# Patient Record
Sex: Female | Born: 1990 | Hispanic: Yes | Marital: Single | Smoking: Never smoker
Health system: Southern US, Community
[De-identification: ages and names within clinical notes are randomized; demographics above are authoritative.]

## PROBLEM LIST (undated history)

## (undated) ENCOUNTER — Inpatient Hospital Stay (HOSPITAL_COMMUNITY): Payer: Self-pay

## (undated) DIAGNOSIS — O24419 Gestational diabetes mellitus in pregnancy, unspecified control: Secondary | ICD-10-CM

## (undated) DIAGNOSIS — B999 Unspecified infectious disease: Secondary | ICD-10-CM

## (undated) DIAGNOSIS — Z8619 Personal history of other infectious and parasitic diseases: Secondary | ICD-10-CM

## (undated) DIAGNOSIS — R51 Headache: Secondary | ICD-10-CM

## (undated) DIAGNOSIS — R519 Headache, unspecified: Secondary | ICD-10-CM

## (undated) HISTORY — PX: NO PAST SURGERIES: SHX2092

## (undated) HISTORY — DX: Headache: R51

## (undated) HISTORY — DX: Headache, unspecified: R51.9

## (undated) HISTORY — DX: Personal history of other infectious and parasitic diseases: Z86.19

## (undated) HISTORY — PX: WISDOM TOOTH EXTRACTION: SHX21

---
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
## 2008-06-25 ENCOUNTER — Emergency Department (HOSPITAL_COMMUNITY): Admission: EM | Admit: 2008-06-25 | Discharge: 2008-06-25 | Payer: Self-pay | Admitting: Emergency Medicine

## 2009-07-24 ENCOUNTER — Emergency Department (HOSPITAL_COMMUNITY): Admission: EM | Admit: 2009-07-24 | Discharge: 2009-07-24 | Payer: Self-pay | Admitting: Emergency Medicine

## 2009-08-17 ENCOUNTER — Emergency Department (HOSPITAL_COMMUNITY): Admission: EM | Admit: 2009-08-17 | Discharge: 2009-08-17 | Payer: Self-pay | Admitting: Emergency Medicine

## 2009-09-30 ENCOUNTER — Emergency Department (HOSPITAL_COMMUNITY): Admission: EM | Admit: 2009-09-30 | Discharge: 2009-09-30 | Payer: Self-pay | Admitting: Emergency Medicine

## 2009-11-04 ENCOUNTER — Emergency Department (HOSPITAL_COMMUNITY): Admission: EM | Admit: 2009-11-04 | Discharge: 2009-11-04 | Payer: Self-pay | Admitting: Emergency Medicine

## 2010-07-05 LAB — URINALYSIS, ROUTINE W REFLEX MICROSCOPIC
Bilirubin Urine: NEGATIVE
Glucose, UA: NEGATIVE mg/dL
Hgb urine dipstick: NEGATIVE
Ketones, ur: NEGATIVE mg/dL
Urobilinogen, UA: 1 mg/dL (ref 0.0–1.0)
pH: 6 (ref 5.0–8.0)

## 2010-07-05 LAB — DIFFERENTIAL
Basophils Absolute: 0 10*3/uL (ref 0.0–0.1)
Eosinophils Absolute: 0.1 10*3/uL (ref 0.0–0.7)
Lymphs Abs: 2 10*3/uL (ref 0.7–4.0)
Monocytes Absolute: 0.6 10*3/uL (ref 0.1–1.0)
Neutro Abs: 3.8 10*3/uL (ref 1.7–7.7)
Neutrophils Relative %: 58 % (ref 43–77)

## 2010-07-05 LAB — URINE MICROSCOPIC-ADD ON

## 2010-07-05 LAB — URINE CULTURE: Culture: NO GROWTH

## 2010-07-05 LAB — CBC
Hemoglobin: 14 g/dL (ref 12.0–15.0)
MCHC: 33.7 g/dL (ref 30.0–36.0)
WBC: 6.5 10*3/uL (ref 4.0–10.5)

## 2010-07-05 LAB — LIPASE, BLOOD: Lipase: 36 U/L (ref 11–59)

## 2010-07-05 LAB — COMPREHENSIVE METABOLIC PANEL
Calcium: 9.2 mg/dL (ref 8.4–10.5)
Potassium: 3.6 mEq/L (ref 3.5–5.1)
Total Bilirubin: 0.5 mg/dL (ref 0.3–1.2)
Total Protein: 7.3 g/dL (ref 6.0–8.3)

## 2010-07-07 LAB — URINALYSIS, ROUTINE W REFLEX MICROSCOPIC
Bilirubin Urine: NEGATIVE
Glucose, UA: NEGATIVE mg/dL
Hgb urine dipstick: NEGATIVE
Ketones, ur: NEGATIVE mg/dL
Protein, ur: NEGATIVE mg/dL
Urobilinogen, UA: 0.2 mg/dL (ref 0.0–1.0)
pH: 6 (ref 5.0–8.0)

## 2010-07-07 LAB — DIFFERENTIAL
Basophils Relative: 0 % (ref 0–1)
Lymphocytes Relative: 36 % (ref 12–46)
Monocytes Relative: 7 % (ref 3–12)
Neutro Abs: 3.5 10*3/uL (ref 1.7–7.7)

## 2010-07-07 LAB — CBC
HCT: 44.2 % (ref 36.0–46.0)
Hemoglobin: 14.6 g/dL (ref 12.0–15.0)
WBC: 6.4 10*3/uL (ref 4.0–10.5)

## 2010-07-07 LAB — URINE MICROSCOPIC-ADD ON

## 2010-07-07 LAB — BASIC METABOLIC PANEL
GFR calc Af Amer: >60 mL/min (ref >60)
GFR calc non Af Amer: >60 mL/min (ref >60)
Sodium: 137 mEq/L (ref 135–145)

## 2010-07-07 LAB — POCT PREGNANCY, URINE: Preg Test, Ur: NEGATIVE

## 2010-07-08 LAB — URINALYSIS, ROUTINE W REFLEX MICROSCOPIC
Hgb urine dipstick: NEGATIVE
Nitrite: NEGATIVE
Protein, ur: NEGATIVE mg/dL
Specific Gravity, Urine: 1.024 (ref 1.005–1.030)
pH: 6 (ref 5.0–8.0)

## 2010-07-08 LAB — URINE CULTURE
Colony Count: NO GROWTH
Culture: NO GROWTH

## 2010-07-08 LAB — URINE MICROSCOPIC-ADD ON

## 2010-07-08 LAB — POCT I-STAT, CHEM 8
Calcium, Ion: 1.17 mmol/L (ref 1.12–1.32)
Creatinine, Ser: 0.7 mg/dL (ref 0.4–1.2)
Glucose, Bld: 89 mg/dL (ref 70–99)
HCT: 42 % (ref 36.0–46.0)
Hemoglobin: 14.3 g/dL (ref 12.0–15.0)
Potassium: 3.1 mEq/L — ABNORMAL LOW (ref 3.5–5.1)
TCO2: 22 mmol/L (ref 0–100)

## 2010-07-08 LAB — PREGNANCY, URINE: Preg Test, Ur: NEGATIVE

## 2010-07-31 LAB — POCT I-STAT, CHEM 8
BUN: 11 mg/dL (ref 6–23)
Creatinine, Ser: 0.7 mg/dL (ref 0.4–1.2)
Glucose, Bld: 99 mg/dL (ref 70–99)
Hemoglobin: 13.9 g/dL (ref 12.0–15.0)
Potassium: 3.5 mEq/L (ref 3.5–5.1)
Sodium: 138 mEq/L (ref 135–145)

## 2010-07-31 LAB — CBC
MCHC: 33.4 g/dL (ref 30.0–36.0)
Platelets: 264 10*3/uL (ref 150–400)
RDW: 14.7 % (ref 11.5–15.5)

## 2010-07-31 LAB — URINALYSIS, ROUTINE W REFLEX MICROSCOPIC
Ketones, ur: 15 mg/dL — AB
Nitrite: NEGATIVE
Specific Gravity, Urine: 1.01 (ref 1.005–1.030)
Urobilinogen, UA: 0.2 mg/dL (ref 0.0–1.0)
pH: 7 (ref 5.0–8.0)

## 2010-07-31 LAB — DIFFERENTIAL
Basophils Absolute: 0 10*3/uL (ref 0.0–0.1)
Basophils Relative: 0 % (ref 0–1)
Eosinophils Relative: 0 % (ref 0–5)
Lymphocytes Relative: 9 % — ABNORMAL LOW (ref 12–46)
Neutro Abs: 14 10*3/uL — ABNORMAL HIGH (ref 1.7–7.7)

## 2010-07-31 LAB — URINE MICROSCOPIC-ADD ON

## 2010-07-31 LAB — GLUCOSE, CAPILLARY: Glucose-Capillary: 129 mg/dL — ABNORMAL HIGH (ref 70–99)

## 2010-09-05 NOTE — Discharge Summary (Signed)
Kathryn Cruz, Kathryn Cruz              ACCOUNT NO.:  [id number]   MEDICAL RECORD NO.:  [id number]          PATIENT TYPE:  INP   LOCATION:  9154                          FACILITY:  [facility]   PHYSICIAN:  Lesly Dukes, M.D. DATE OF BIRTH:  1991/02/07   DATE OF ADMISSION:  05/21/2006  DATE OF DISCHARGE:  05/23/2006                               DISCHARGE SUMMARY   DISCHARGE DIAGNOSES:  1. Term pregnancy, delivered.  2. Mild preeclampsia, resolved.   DISCHARGE MEDICATIONS:  1. Ibuprofen 600 mg p.o. every six hours as needed.  2. Prenatal vitamins daily while breast feeding.   HOSPITAL COURSE:  This is a 20 year old G1 who presented at 96 and [redacted]  weeks gestational age in active labor and delivered a viable female infant  with Apgars of 8 at 1 minute and 9 at 5 minutes over a first-degree  perineal laceration with bilateral periurethral laceration repaired.  A  three-vessel cord placenta was delivered spontaneously intact.  The  patient received local lidocaine anesthesia.  The patient was placed on  magnesium for elevated blood pressures postpartum up to 140s-150s/90s.  The patient was transferred to [facility] for initiation of magnesium therapy  postpartum.  The patient's initial [facility] labs showed platelets on May 21, 2006 at 129, AST of 39, ALT of 21, LDH of 189, and uric acid of 7.9.  On May 22, 2006, the patient's [facility] labs showed platelets of 106, AST  of 59, ALT of 23, LDH of 283, and uric acid of 8.0.  PIH labs on the day  of discharge showed platelets of 110, AST of 51, ALT of 24, LDH of 293,  and uric acid of 7.4.  The patient remained on magnesium for almost 48  hours postpartum, given inadequate diuresis initially.  The patient was  adequately diuresing at the time of discharge, with negative fluid  balance and blood pressures remaining consistently in the 110s-130s/70s-  80s.  The patient had no symptoms of headache, shortness of breath, or  right upper quadrant pain at the time  of discharge.  At the time of  discharge, the patient's lungs were clear to auscultation and reflexes  were 2+ bilaterally.  The uterus was firm and below the umbilicus, and  the patient had no excessive bleeding.   On postpartum day #1, the patient's magnesium level was found to be 8.4;  thus, her dose was held and her rate was decreased to 1 g per hour.  The  patient continued to tolerate this well.  The patient's magnesium was  discontinued on the day of discharge, and the patient was monitored for  four hours after discontinuation of magnesium.  Her vital signs remained  stable, and she remained asymptomatic.  The patient was discharged with  preeclampsia precautions.  The patient's [facility] nurse is to check her  blood pressure in one week.   DISCHARGE INSTRUCTIONS:  1. The patient is to follow pelvic rest for six weeks, with no sexual      activity for six weeks and no heavy lifting.  2. The patient may follow a routine diet.  3. The patient is to be given routine postpartum instructions as well      as preeclampsia precautions.   DISPOSITION:  The patient is discharged to home in stable condition with  [facility] nurse to recheck blood pressure in one week.   FOLLOW UP:  The patient is to [facility] up with [facility] in  six weeks postpartum.  The patient will receive Depo-Provera injection  prior to discharge.   PERTINENT LABORATORY DATA:  Hemoglobin 10.2 on the day of discharge.  Prenatal labs showed a blood type A positive, antibody negative, GBS  negative, RPR nonreactive, hepatitis B surface antigen negative, and HIV  nonreactive.   The patient was discharged home in stable condition.     ______________________________  Drue Dun, M.D.    ______________________________  Lesly Dukes, M.D.    EE/MEDQ  D:  05/23/2006  T:  05/23/2006  Job:  [id number]

## 2011-01-23 ENCOUNTER — Other Ambulatory Visit (HOSPITAL_COMMUNITY)
Admission: RE | Admit: 2011-01-23 | Discharge: 2011-01-23 | Disposition: A | Discharge status: Home / Self Care | Payer: PRIVATE HEALTH INSURANCE | Source: Ambulatory Visit | Attending: Family Medicine | Admitting: Family Medicine

## 2011-01-23 ENCOUNTER — Other Ambulatory Visit: Payer: Self-pay | Admitting: Family Medicine

## 2011-01-23 DIAGNOSIS — R87810 Cervical high risk human papillomavirus (HPV) DNA test positive: Secondary | ICD-10-CM | POA: Insufficient documentation

## 2011-01-23 DIAGNOSIS — Z113 Encounter for screening for infections with a predominantly sexual mode of transmission: Secondary | ICD-10-CM | POA: Insufficient documentation

## 2011-01-23 DIAGNOSIS — Z124 Encounter for screening for malignant neoplasm of cervix: Secondary | ICD-10-CM | POA: Insufficient documentation

## 2011-02-23 ENCOUNTER — Encounter: Payer: Self-pay | Admitting: *Deleted

## 2011-02-23 ENCOUNTER — Emergency Department (HOSPITAL_COMMUNITY)
Admission: EM | Admit: 2011-02-23 | Discharge: 2011-02-24 | Disposition: A | Discharge status: Home / Self Care | Payer: 59 | Attending: Emergency Medicine | Admitting: Emergency Medicine

## 2011-02-23 DIAGNOSIS — R109 Unspecified abdominal pain: Secondary | ICD-10-CM | POA: Insufficient documentation

## 2011-02-23 DIAGNOSIS — R51 Headache: Secondary | ICD-10-CM | POA: Insufficient documentation

## 2011-02-23 DIAGNOSIS — M545 Low back pain, unspecified: Secondary | ICD-10-CM | POA: Insufficient documentation

## 2011-02-23 DIAGNOSIS — M549 Dorsalgia, unspecified: Secondary | ICD-10-CM

## 2011-02-23 DIAGNOSIS — R5381 Other malaise: Secondary | ICD-10-CM | POA: Insufficient documentation

## 2011-02-23 DIAGNOSIS — R112 Nausea with vomiting, unspecified: Secondary | ICD-10-CM | POA: Insufficient documentation

## 2011-02-23 DIAGNOSIS — R319 Hematuria, unspecified: Secondary | ICD-10-CM | POA: Insufficient documentation

## 2011-02-23 NOTE — ED Notes (Signed)
Back pain for one week with bad headaches

## 2011-02-24 LAB — URINE MICROSCOPIC-ADD ON

## 2011-02-24 LAB — URINALYSIS, ROUTINE W REFLEX MICROSCOPIC
Bilirubin Urine: NEGATIVE
Ketones, ur: NEGATIVE mg/dL
Leukocytes, UA: NEGATIVE
Nitrite: NEGATIVE
Specific Gravity, Urine: 1.009 (ref 1.005–1.030)
Urobilinogen, UA: 0.2 mg/dL (ref 0.0–1.0)
pH: 6 (ref 5.0–8.0)

## 2011-02-24 LAB — CBC
HCT: 35.2 % — ABNORMAL LOW (ref 36.0–46.0)
Hemoglobin: 11.5 g/dL — ABNORMAL LOW (ref 12.0–15.0)
MCH: 25.7 pg — ABNORMAL LOW (ref 26.0–34.0)
MCHC: 32.7 g/dL (ref 30.0–36.0)
MCV: 78.6 fL (ref 78.0–100.0)
RBC: 4.48 MIL/uL (ref 3.87–5.11)

## 2011-02-24 LAB — WET PREP, GENITAL
Trich, Wet Prep: NONE SEEN
Yeast Wet Prep HPF POC: NONE SEEN

## 2011-02-24 LAB — COMPREHENSIVE METABOLIC PANEL
Albumin: 3.4 g/dL — ABNORMAL LOW (ref 3.5–5.2)
Alkaline Phosphatase: 61 U/L (ref 39–117)
BUN: 11 mg/dL (ref 6–23)
Creatinine, Ser: 0.85 mg/dL (ref 0.50–1.10)
GFR calc Af Amer: >90 mL/min (ref >90)
Glucose, Bld: 102 mg/dL — ABNORMAL HIGH (ref 70–99)
Total Protein: 6.8 g/dL (ref 6.0–8.3)

## 2011-02-24 LAB — DIFFERENTIAL
Basophils Relative: 0 % (ref 0–1)
Eosinophils Absolute: 0.2 10*3/uL (ref 0.0–0.7)
Eosinophils Relative: 1 % (ref 0–5)
Lymphs Abs: 2.8 10*3/uL (ref 0.7–4.0)
Monocytes Absolute: 0.8 10*3/uL (ref 0.1–1.0)
Monocytes Relative: 7 % (ref 3–12)

## 2011-02-24 LAB — PREGNANCY, URINE: Preg Test, Ur: NEGATIVE

## 2011-02-24 MED ORDER — KETOROLAC TROMETHAMINE 30 MG/ML IJ SOLN
30.0000 mg | Freq: Once | INTRAMUSCULAR | Status: AC
  Administered 2011-02-24: 30 mg via INTRAVENOUS

## 2011-02-24 MED ORDER — OXYCODONE-ACETAMINOPHEN 5-325 MG PO TABS: 1.0000 | ORAL_TABLET | ORAL | Status: AC | PRN

## 2011-02-24 MED ORDER — KETOROLAC TROMETHAMINE 30 MG/ML IJ SOLN
INTRAMUSCULAR | Status: AC
  Filled 2011-02-24: qty 1

## 2011-02-24 MED ORDER — SODIUM CHLORIDE 0.9 % IV BOLUS (SEPSIS): 500.0000 mL | Freq: Once | INTRAVENOUS | Status: DC

## 2011-02-24 NOTE — ED Provider Notes (Signed)
History     CSN: [id number] Arrival date & time: 02/23/2011 10:43 PM   First MD Initiated Contact with Patient 02/24/11 [phone]      Chief Complaint  Patient presents with  . Back Pain    (Consider location/radiation/quality/duration/timing/severity/associated sxs/prior treatment) Patient is a 20 y.o. female presenting with back pain. The history is provided by the patient.  Back Pain  This is a new problem. The current episode started more than 1 week ago. Associated symptoms include headaches and abdominal pain. Pertinent negatives include no chest pain, no fever, no numbness and no dysuria.   patient states she's had pain in her lower back for the last week. She initially had some headaches and I said in back pain. She says it goes around to her lower abdomen. She saw her PCP for any she states that he did nothing. She had blood in her urine reportedly. She says she was told that it could be kidney stone. It is worse with movement. Worse with walking. Her last period was 3 weeks ago and was normal. No vaginal discharge. She's had vomiting. She had a headache. No diarrhea or constipation. She states that she had negative pregnancy test today. No injury. No history of back problems.  History reviewed. No pertinent past medical history.  History reviewed. No pertinent past surgical history.  History reviewed. No pertinent family history.  History  Substance Use Topics  . Smoking status: Not on file  . Smokeless tobacco: Not on file  . Alcohol Use: No    OB History    Grav Para Term Preterm Abortions TAB SAB Ect Mult Living                  Review of Systems  Constitutional: Positive for fatigue. Negative for fever, activity change and appetite change.  HENT: Negative for nosebleeds and neck pain.   Eyes: Negative for pain.  Respiratory: Negative for chest tightness.   Cardiovascular: Negative for chest pain.  Gastrointestinal: Positive for nausea, vomiting and abdominal pain.  Negative for diarrhea, constipation, blood in stool, abdominal distention and anal bleeding.  Genitourinary: Negative for dysuria, flank pain, vaginal bleeding, vaginal discharge and vaginal pain.  Musculoskeletal: Positive for back pain. Negative for gait problem.  Neurological: Positive for headaches. Negative for light-headedness and numbness.  Hematological: Negative for adenopathy.  Psychiatric/Behavioral: Negative for confusion.    Allergies  Review of patient's allergies indicates no known allergies.  Home Medications   Current Outpatient Rx  Name Route Sig Dispense Refill  . ACETAMINOPHEN 500 MG PO TABS Oral Take 1,000 mg by mouth every 6 (six) hours as needed. For pain     . OXYCODONE-ACETAMINOPHEN 5-325 MG PO TABS Oral Take 1 tablet by mouth every 4 (four) hours as needed for pain. 15 tablet 0  . OXYCODONE-ACETAMINOPHEN 5-325 MG PO TABS Oral Take 1 tablet by mouth every 4 (four) hours as needed for pain. 15 tablet 0    BP 98/68  Pulse 81  Temp(Src) 97.3 F (36.3 C) (Oral)  Resp 15  SpO2 99%  Physical Exam  Nursing note and vitals reviewed. Constitutional: She is oriented to person, place, and time. She appears well-developed and well-nourished.  HENT:  Head: Normocephalic and atraumatic.  Neck: Normal range of motion. Neck supple.  Cardiovascular: Normal rate, regular rhythm and normal heart sounds.   No murmur heard. Pulmonary/Chest: Effort normal and breath sounds normal. No respiratory distress. She has no wheezes. She has no rales.  Abdominal: Soft.  Bowel sounds are normal. She exhibits no distension. There is no tenderness. There is no rebound and no guarding.  Musculoskeletal: Normal range of motion.       No lumbar tenderness. No CVA tenderness.   Neurological: She is alert and oriented to person, place, and time. No cranial nerve deficit.  Skin: Skin is warm and dry.  Psychiatric: She has a normal mood and affect. Her speech is normal.   pelvic exam showed  no pertinent vaginal discharge. Minimal blood. Slight irregularity of the cervix. No cervical motion tenderness.  ED Course  Procedures (including critical care time)  Labs Reviewed  CBC - Abnormal; Notable for the following:    Hemoglobin 11.5 (*)    HCT 35.2 (*)    MCH 25.7 (*)    All other components within normal limits  COMPREHENSIVE METABOLIC PANEL - Abnormal; Notable for the following:    Potassium 3.2 (*)    Glucose, Bld 102 (*)    Albumin 3.4 (*)    All other components within normal limits  URINALYSIS, ROUTINE W REFLEX MICROSCOPIC - Abnormal; Notable for the following:    Hgb urine dipstick SMALL (*)    Protein, ur 100 (*)    All other components within normal limits  WET PREP, GENITAL - Abnormal; Notable for the following:    WBC, Wet Prep HPF POC FEW (*)    All other components within normal limits  DIFFERENTIAL  PREGNANCY, URINE  LIPASE, BLOOD  POCT PREGNANCY, URINE  URINE MICROSCOPIC-ADD ON  GC/CHLAMYDIA PROBE AMP, GENITAL   No results found.   1. Back pain       MDM  Back pain for weeks also has headaches. Nonfocal exam. Lab work is reassuring minus a little bit of protein in the urine. A mild anemia. No UTI. Patient need to follow with her primary care for further evaluation. She states that she feels better after treatment with Toradol.        Kathryn Cruz. Rubin Payor, MD 02/24/11 6295

## 2011-02-24 NOTE — ED Notes (Signed)
Pt reports having bilateral back pain, abdominal pain, groin pain x 4 days with a HA x 3 weeks.   States that she went to her PCP today and was told that she possibly had kidney stones.  Pt reports blood in urine.  Pt tender on palpation of her back.  Skin warm, dry and intact.  Neuro intact.

## 2011-02-25 LAB — GC/CHLAMYDIA PROBE AMP, GENITAL
Chlamydia, DNA Probe: NEGATIVE
GC Probe Amp, Genital: NEGATIVE

## 2011-03-04 ENCOUNTER — Other Ambulatory Visit: Payer: Self-pay | Admitting: Nurse Practitioner

## 2011-04-18 ENCOUNTER — Emergency Department (HOSPITAL_COMMUNITY): Payer: 59

## 2011-04-18 ENCOUNTER — Emergency Department (HOSPITAL_COMMUNITY)
Admission: EM | Admit: 2011-04-18 | Discharge: 2011-04-18 | Disposition: A | Discharge status: Home / Self Care | Payer: 59 | Attending: Emergency Medicine | Admitting: Emergency Medicine

## 2011-04-18 ENCOUNTER — Encounter (HOSPITAL_COMMUNITY): Payer: Self-pay | Admitting: Adult Health

## 2011-04-18 DIAGNOSIS — R000 Tachycardia, unspecified: Secondary | ICD-10-CM | POA: Insufficient documentation

## 2011-04-18 DIAGNOSIS — R0602 Shortness of breath: Secondary | ICD-10-CM | POA: Insufficient documentation

## 2011-04-18 DIAGNOSIS — R059 Cough, unspecified: Secondary | ICD-10-CM | POA: Insufficient documentation

## 2011-04-18 DIAGNOSIS — J40 Bronchitis, not specified as acute or chronic: Secondary | ICD-10-CM | POA: Insufficient documentation

## 2011-04-18 DIAGNOSIS — R05 Cough: Secondary | ICD-10-CM | POA: Insufficient documentation

## 2011-04-18 MED ORDER — SODIUM CHLORIDE 0.9 % IV BOLUS (SEPSIS)
1000.0000 mL | Freq: Once | INTRAVENOUS | Status: AC
  Administered 2011-04-18: 1000 mL via INTRAVENOUS

## 2011-04-18 MED ORDER — IBUPROFEN 800 MG PO TABS
800.0000 mg | ORAL_TABLET | Freq: Once | ORAL | Status: AC
  Administered 2011-04-18: 800 mg via ORAL
  Filled 2011-04-18: qty 1

## 2011-04-18 MED ORDER — KETOROLAC TROMETHAMINE 30 MG/ML IJ SOLN
30.0000 mg | Freq: Once | INTRAMUSCULAR | Status: AC
  Administered 2011-04-18: 30 mg via INTRAVENOUS
  Filled 2011-04-18: qty 1

## 2011-04-18 MED ORDER — PREDNISONE 20 MG PO TABS: ORAL_TABLET | ORAL | Status: AC

## 2011-04-18 MED ORDER — ACETAMINOPHEN 325 MG PO TABS
650.0000 mg | ORAL_TABLET | Freq: Once | ORAL | Status: AC
  Administered 2011-04-18: 650 mg via ORAL
  Filled 2011-04-18: qty 2

## 2011-04-18 MED ORDER — ALBUTEROL SULFATE HFA 108 (90 BASE) MCG/ACT IN AERS
2.0000 | INHALATION_SPRAY | Freq: Once | RESPIRATORY_TRACT | Status: AC
  Administered 2011-04-18: 2 via RESPIRATORY_TRACT
  Filled 2011-04-18 (×2): qty 6.7

## 2011-04-18 MED ORDER — TRAMADOL HCL 50 MG PO TABS: 50.0000 mg | ORAL_TABLET | Freq: Four times a day (QID) | ORAL | Status: AC | PRN

## 2011-04-18 MED ORDER — DEXAMETHASONE SODIUM PHOSPHATE 10 MG/ML IJ SOLN
10.0000 mg | Freq: Once | INTRAMUSCULAR | Status: AC
  Administered 2011-04-18: 10 mg via INTRAVENOUS
  Filled 2011-04-18: qty 1

## 2011-04-18 NOTE — ED Notes (Signed)
Pt sts that she has been sick on and off for the past few weeks and that her pain and sickness came back yesterday. Sts that she is having a dry, non productive cough and generalized body aches. Pt sts that she is beginning to have a headache, pain 8/10. Sts that she has felt feverish since last night.  Pt has taken two tylenol and ibuprofen today at 1pm without relief from pain.

## 2011-04-18 NOTE — ED Notes (Signed)
Patient given discharge instructions, information, prescriptions, and diet order. Patient states that they adequately understand discharge information given and to return to ED if symptoms return or worsen.     

## 2011-04-18 NOTE — ED Notes (Signed)
Two weeks of flu like symptoms with non productive cough. Given cough syrup at [facility] and pt states the cough has gotten woprse.

## 2011-04-18 NOTE — ED Provider Notes (Signed)
History     CSN: [id number]  Arrival date & time 04/18/11  1324   First MD Initiated Contact with Patient 04/18/11 1341      HPI Pt reports flu like symptoms for 2 weeks. Reports nonproductive cough, body aches. Reports she was given cough syrup in urgent care which symptoms. She does report subjective fever. Denies chest pain, shortness of breath, nausea, vomiting, abdominal pain.  Patient is a 20 y.o. female presenting with cough. The history is provided by the patient.  Cough This is a new problem. The current episode started more than 1 week ago. The problem occurs constantly. The problem has been gradually worsening. The cough is non-productive. Maximum temperature: subjective fever. Associated symptoms include rhinorrhea, sore throat and wheezing. Pertinent negatives include no chest pain, no chills, no ear congestion, no ear pain, no headaches, no myalgias and no shortness of breath. She has tried decongestants for the symptoms. The treatment provided no relief. Her past medical history does not include asthma.    History reviewed. No pertinent past medical history.  History reviewed. No pertinent past surgical history.  History reviewed. No pertinent family history.  History  Substance Use Topics  . Smoking status: Not on file  . Smokeless tobacco: Not on file  . Alcohol Use: No    OB History    Grav Para Term Preterm Abortions TAB SAB Ect Mult Living                  Review of Systems  Constitutional: Negative for fever and chills.  HENT: Positive for congestion, sore throat, rhinorrhea and trouble swallowing. Negative for ear pain, facial swelling, sneezing, neck pain, neck stiffness, postnasal drip and sinus pressure.   Respiratory: Positive for cough and wheezing. Negative for shortness of breath.   Cardiovascular: Negative for chest pain.  Gastrointestinal: Negative for nausea, vomiting and abdominal pain.  Musculoskeletal: Negative for myalgias.  Skin: Negative  for rash.  Neurological: Negative for dizziness, light-headedness and headaches.  All other systems reviewed and are negative.    Allergies  Review of patient's allergies indicates no known allergies.  Home Medications   Current Outpatient Rx  Name Route Sig Dispense Refill  . ACETAMINOPHEN 500 MG PO TABS Oral Take 1,000 mg by mouth every 6 (six) hours as needed. For pain     . IBUPROFEN 200 MG PO TABS Oral Take 200 mg by mouth every 6 (six) hours as needed. pain       BP 98/68  Pulse 144  Temp 100 F (37.8 C)  Resp 20  Physical Exam  Vitals reviewed. Constitutional: She is oriented to person, place, and time. Vital signs are normal. She appears well-developed and well-nourished.  HENT:  Head: Normocephalic and atraumatic.  Right Ear: External ear normal.  Left Ear: External ear normal.  Nose: Nose normal.  Mouth/Throat: Oropharynx is clear and moist. No oropharyngeal exudate.  Eyes: Conjunctivae are normal. Pupils are equal, round, and reactive to light.  Neck: Normal range of motion. Neck supple. No tracheal deviation present.  Cardiovascular: Regular rhythm and normal heart sounds.  Tachycardia present.  Exam reveals no friction rub.   No murmur heard. Pulmonary/Chest: Effort normal and breath sounds normal. She has no wheezes. She has no rhonchi. She has no rales. She exhibits no tenderness.  Abdominal: Soft. Bowel sounds are normal. She exhibits no distension and no mass. There is no tenderness. There is no rebound and no guarding.  Musculoskeletal: Normal range of motion.  Neurological: She is alert and oriented to person, place, and time. Coordination normal.  Skin: Skin is warm and dry. No rash noted. No erythema. No pallor.    ED Course  Procedures  Dg Chest 2 View  04/18/2011  *RADIOLOGY REPORT*  Clinical Data: Chest pain, shortness of breath, cough, and chest congestion.  CHEST - 2 VIEW  Comparison: None.  Findings: There is peribronchial thickening  consistent with bronchitis.  The lungs are otherwise clear.  Heart size and vascularity are normal.  No effusions.  No osseous abnormality.  IMPRESSION: Bronchitic changes.  Original Report Authenticated By: Gwynn Burly, M.D.   MDM   3:20 PM Discuss treatment plan with patient. Will give albuterol inhaler and prednisone pack prescription. We'll give him another liter of fluids to help with heart rate, which has significantly improved. On monitor heart rate is 111 bpm       Thomasene Lot, [place] 04/18/11 [phone]

## 2011-04-19 NOTE — ED Provider Notes (Signed)
Medical screening examination/treatment/procedure(s) were performed by non-physician practitioner and as supervising physician I was immediately available for consultation/collaboration.   Raine Blodgett E Dewanna Hurston, MD 04/19/11 0720 

## 2011-09-10 ENCOUNTER — Other Ambulatory Visit (HOSPITAL_COMMUNITY)
Admission: RE | Admit: 2011-09-10 | Discharge: 2011-09-10 | Disposition: A | Discharge status: Home / Self Care | Payer: Self-pay | Source: Ambulatory Visit | Attending: Obstetrics and Gynecology | Admitting: Obstetrics and Gynecology

## 2011-09-10 ENCOUNTER — Other Ambulatory Visit: Payer: Self-pay | Admitting: Obstetrics and Gynecology

## 2011-09-10 DIAGNOSIS — Z113 Encounter for screening for infections with a predominantly sexual mode of transmission: Secondary | ICD-10-CM | POA: Insufficient documentation

## 2011-09-10 DIAGNOSIS — Z01419 Encounter for gynecological examination (general) (routine) without abnormal findings: Secondary | ICD-10-CM | POA: Insufficient documentation

## 2012-03-15 ENCOUNTER — Other Ambulatory Visit (HOSPITAL_COMMUNITY)
Admission: RE | Admit: 2012-03-15 | Discharge: 2012-03-15 | Disposition: A | Discharge status: Home / Self Care | Payer: 59 | Source: Ambulatory Visit | Attending: Obstetrics and Gynecology | Admitting: Obstetrics and Gynecology

## 2012-03-15 ENCOUNTER — Other Ambulatory Visit: Payer: Self-pay | Admitting: Obstetrics and Gynecology

## 2012-03-15 DIAGNOSIS — Z01419 Encounter for gynecological examination (general) (routine) without abnormal findings: Secondary | ICD-10-CM | POA: Insufficient documentation

## 2013-03-13 ENCOUNTER — Other Ambulatory Visit (HOSPITAL_COMMUNITY)
Admission: RE | Admit: 2013-03-13 | Discharge: 2013-03-13 | Disposition: A | Discharge status: Home / Self Care | Payer: BC Managed Care – PPO | Source: Ambulatory Visit | Attending: Obstetrics and Gynecology | Admitting: Obstetrics and Gynecology

## 2013-03-13 ENCOUNTER — Other Ambulatory Visit: Payer: Self-pay | Admitting: Obstetrics and Gynecology

## 2013-03-13 DIAGNOSIS — Z1151 Encounter for screening for human papillomavirus (HPV): Secondary | ICD-10-CM | POA: Insufficient documentation

## 2013-03-13 DIAGNOSIS — R87810 Cervical high risk human papillomavirus (HPV) DNA test positive: Secondary | ICD-10-CM | POA: Insufficient documentation

## 2013-03-13 DIAGNOSIS — Z124 Encounter for screening for malignant neoplasm of cervix: Secondary | ICD-10-CM | POA: Insufficient documentation

## 2013-04-24 ENCOUNTER — Other Ambulatory Visit: Payer: Self-pay | Admitting: Obstetrics and Gynecology

## 2013-05-08 ENCOUNTER — Other Ambulatory Visit: Payer: Self-pay | Admitting: Physician Assistant

## 2013-05-08 DIAGNOSIS — R51 Headache: Secondary | ICD-10-CM

## 2013-05-12 ENCOUNTER — Other Ambulatory Visit: Payer: 59

## 2014-06-07 ENCOUNTER — Other Ambulatory Visit: Payer: Self-pay | Admitting: Obstetrics and Gynecology

## 2014-06-07 ENCOUNTER — Other Ambulatory Visit (HOSPITAL_COMMUNITY)
Admission: RE | Admit: 2014-06-07 | Discharge: 2014-06-07 | Disposition: A | Discharge status: Home / Self Care | Payer: BLUE CROSS/BLUE SHIELD | Source: Ambulatory Visit | Attending: Obstetrics and Gynecology | Admitting: Obstetrics and Gynecology

## 2014-06-07 DIAGNOSIS — Z1151 Encounter for screening for human papillomavirus (HPV): Secondary | ICD-10-CM | POA: Diagnosis present

## 2014-06-07 DIAGNOSIS — Z01419 Encounter for gynecological examination (general) (routine) without abnormal findings: Secondary | ICD-10-CM | POA: Diagnosis present

## 2014-06-11 LAB — CYTOLOGY - PAP

## 2015-09-09 ENCOUNTER — Emergency Department (HOSPITAL_BASED_OUTPATIENT_CLINIC_OR_DEPARTMENT_OTHER): Payer: Medicaid Other

## 2015-09-09 ENCOUNTER — Encounter (HOSPITAL_BASED_OUTPATIENT_CLINIC_OR_DEPARTMENT_OTHER): Payer: Self-pay | Admitting: *Deleted

## 2015-09-09 DIAGNOSIS — R109 Unspecified abdominal pain: Secondary | ICD-10-CM | POA: Insufficient documentation

## 2015-09-09 DIAGNOSIS — Z3A12 12 weeks gestation of pregnancy: Secondary | ICD-10-CM | POA: Insufficient documentation

## 2015-09-09 DIAGNOSIS — O26891 Other specified pregnancy related conditions, first trimester: Secondary | ICD-10-CM | POA: Insufficient documentation

## 2015-09-09 LAB — HCG, QUANTITATIVE, PREGNANCY: HCG, BETA CHAIN, QUANT, S: 1111 m[IU]/mL — AB (ref <5)

## 2015-09-09 LAB — PREGNANCY, URINE: Preg Test, Ur: POSITIVE — AB

## 2015-09-09 LAB — URINALYSIS, ROUTINE W REFLEX MICROSCOPIC
Bilirubin Urine: NEGATIVE
GLUCOSE, UA: NEGATIVE mg/dL
Hgb urine dipstick: NEGATIVE
KETONES UR: NEGATIVE mg/dL
LEUKOCYTES UA: NEGATIVE
NITRITE: NEGATIVE
PH: 5.5 (ref 5.0–8.0)
PROTEIN: NEGATIVE mg/dL
Specific Gravity, Urine: 1.018 (ref 1.005–1.030)

## 2015-09-09 NOTE — ED Notes (Signed)
Pt reports abdominal cramping x 1 week.  Reports that it feels like menstral cramping-worsening on the right.  Reports nausea, denies vomiting.  States that she is 89 days late for her period.  Increased vaginal discharge over the last week without itching or discomfort.

## 2015-09-10 ENCOUNTER — Emergency Department (HOSPITAL_BASED_OUTPATIENT_CLINIC_OR_DEPARTMENT_OTHER)
Admission: EM | Admit: 2015-09-10 | Discharge: 2015-09-10 | Disposition: A | Discharge status: Home / Self Care | Payer: Medicaid Other | Attending: Emergency Medicine | Admitting: Emergency Medicine

## 2015-09-10 DIAGNOSIS — R109 Unspecified abdominal pain: Secondary | ICD-10-CM

## 2015-09-10 DIAGNOSIS — Z3490 Encounter for supervision of normal pregnancy, unspecified, unspecified trimester: Secondary | ICD-10-CM

## 2015-09-10 LAB — WET PREP, GENITAL
Sperm: NONE SEEN
Trich, Wet Prep: NONE SEEN
Yeast Wet Prep HPF POC: NONE SEEN

## 2015-09-10 MED ORDER — GNP PRENATAL VITAMINS 28-0.8 MG PO TABS: 1.0000 | ORAL_TABLET | Freq: Every day | ORAL | Status: DC

## 2015-09-10 NOTE — ED Provider Notes (Addendum)
CSN: [id number]     Arrival date & time 09/09/15  2020 History  By signing my name below, I, Iona Beard, attest that this documentation has been prepared under the direction and in the presence of Paula Libra, MD.   Electronically Signed: Iona Beard, ED Scribe. 09/10/2015. 1:15 AM   Chief Complaint  Patient presents with  . Abdominal Cramping   HPI HPI Comments: Kathryn Cruz is a 25 y.o. female who presents to the Emergency Department complaining of crampy right-sided abdominal pain, ongoing for one week. Pt reports associated headache, light-headedness, white vaginal discharge and nausea. Pain is worse with movement or palpation. Pt denies vaginal bleeding, fever, chills, vomiting or diarrhea. Pregnancy test was noted to be positive and a pelvic ultrasound was obtained prior to my evaluation. Pt's OB/GYN is Dr. Dion Body with [facility].   History reviewed. No pertinent past medical history. History reviewed. No pertinent past surgical history. History reviewed. No pertinent family history. Social History  Substance Use Topics  . Smoking status: None  . Smokeless tobacco: None  . Alcohol Use: No   OB History    No data available     Review of Systems A complete 10 system review of systems was obtained and all systems are negative except as noted in the HPI and PMH.   Allergies  Review of patient's allergies indicates no known allergies.  Home Medications   Prior to Admission medications   Medication Sig Start Date End Date Taking? Authorizing Provider  acetaminophen (TYLENOL) 500 MG tablet Take 1,000 mg by mouth every 6 (six) hours as needed. For pain     Historical Provider, MD  ibuprofen (ADVIL,MOTRIN) 200 MG tablet Take 200 mg by mouth every 6 (six) hours as needed. pain     Historical Provider, MD   BP 107/71 mmHg  Pulse 77  Temp(Src) 98.7 F (37.1 C) (Oral)  Resp 18  Ht 5' (1.524 m)  Wt 150 lb (68.04 kg)  BMI 29.30 kg/m2  SpO2 100%  LMP  06/13/2015 (Exact Date)  Physical Exam General: Well-developed, well-nourished female in no acute distress; appearance consistent with age of record HENT: normocephalic; atraumatic Eyes: pupils equal, round and reactive to light; extraocular muscles intact Neck: supple Heart: regular rate and rhythm Lungs: clear to auscultation bilaterally Abdomen: soft; nondistended; nontender; no masses or hepatosplenomegaly; bowel sounds present GU: Normal external genitalia; physiologic appearing vaginal discharge; mild cervical motion tenderness; mild left adnexal tenderness Extremities: No deformity; full range of motion; pulses normal Neurologic: Awake, alert and oriented; motor function intact in all extremities and symmetric; no facial droop Skin: Warm and dry Psychiatric: Normal mood and affect  ED Course  Procedures (including critical care time) DIAGNOSTIC STUDIES:   MDM   Nursing notes and vitals signs, including pulse oximetry, reviewed.  Summary of this visit's results, reviewed by myself:  Labs:  Results for orders placed or performed during the hospital encounter of 09/10/15 (from the past 24 hour(s))  Urinalysis, Routine w reflex microscopic (not at [facility])     Status: None   Collection Time: 09/09/15  8:41 PM  Result Value Ref Range   Color, Urine YELLOW YELLOW   APPearance CLEAR CLEAR   Specific Gravity, Urine 1.018 1.005 - 1.030   pH 5.5 5.0 - 8.0   Glucose, UA NEGATIVE NEGATIVE mg/dL   Hgb urine dipstick NEGATIVE NEGATIVE   Bilirubin Urine NEGATIVE NEGATIVE   Ketones, ur NEGATIVE NEGATIVE mg/dL   Protein, ur NEGATIVE NEGATIVE mg/dL  Nitrite NEGATIVE NEGATIVE   Leukocytes, UA NEGATIVE NEGATIVE  Pregnancy, urine     Status: Abnormal   Collection Time: 09/09/15  8:41 PM  Result Value Ref Range   Preg Test, Ur POSITIVE (A) NEGATIVE  hCG, quantitative, pregnancy     Status: Abnormal   Collection Time: 09/09/15 10:30 PM  Result Value Ref Range   hCG, Beta Chain, Quant,  S 1111 (H) <5 mIU/mL  Wet prep, genital     Status: Abnormal   Collection Time: 09/10/15 12:45 AM  Result Value Ref Range   Yeast Wet Prep HPF POC NONE SEEN NONE SEEN   Trich, Wet Prep NONE SEEN NONE SEEN   Clue Cells Wet Prep HPF POC PRESENT (A) NONE SEEN   WBC, Wet Prep HPF POC MODERATE (A) NONE SEEN   Sperm NONE SEEN     Imaging Studies: [place] Ob Comp Less 14 Wks  09/09/2015  CLINICAL DATA:  Acute onset of lower abdominal pain for 1 week. Initial encounter. EXAM: OBSTETRIC <14 WK US AND TRANSVAGINAL OB US TECHNIQUE: Both transabdominal and transvaginal ultrasound examinations were performed for complete evaluation of the gestation as well as the maternal uterus, adnexal regions, and pelvic cul-de-sac. Transvaginal technique was performed to assess early pregnancy. COMPARISON:  None. FINDINGS: Intrauterine gestational sac: None seen. Yolk sac:  N/A Embryo:  N/A Subchorionic hemorrhage:  None visualized. Maternal uterus/adnexae: There appears to be a 2.9 cm fibroid at the posterior aspect of the uterine body. The uterus is otherwise unremarkable. The ovaries are within normal limits. The right ovary measures 3.0 x 2.2 x 2.1 cm, while the left ovary measures 3.6 x 2.6 x 2.8 cm. No suspicious adnexal masses are seen; there is no evidence for ovarian torsion. A small amount of free fluid is seen within the pelvic cul-de-sac. IMPRESSION: 1. No intrauterine gestational sac seen. No evidence for ectopic pregnancy at this time. No quantitative beta HCG level is yet available. Would correlate with quantitative beta HCG levels, and consider follow-up pelvic ultrasound in 2 weeks as deemed clinically appropriate. 2. 2.9 cm fibroid at the posterior aspect of the uterine body. Electronically Signed   By: Roanna RaiderJeffery  Chang M.D.   On: 09/09/2015 23:24   [place] Ob Transvaginal  09/09/2015  CLINICAL DATA:  Acute onset of lower abdominal pain for 1 week. Initial encounter. EXAM: OBSTETRIC <14 WK US AND TRANSVAGINAL OB US  TECHNIQUE: Both transabdominal and transvaginal ultrasound examinations were performed for complete evaluation of the gestation as well as the maternal uterus, adnexal regions, and pelvic cul-de-sac. Transvaginal technique was performed to assess early pregnancy. COMPARISON:  None. FINDINGS: Intrauterine gestational sac: None seen. Yolk sac:  N/A Embryo:  N/A Subchorionic hemorrhage:  None visualized. Maternal uterus/adnexae: There appears to be a 2.9 cm fibroid at the posterior aspect of the uterine body. The uterus is otherwise unremarkable. The ovaries are within normal limits. The right ovary measures 3.0 x 2.2 x 2.1 cm, while the left ovary measures 3.6 x 2.6 x 2.8 cm. No suspicious adnexal masses are seen; there is no evidence for ovarian torsion. A small amount of free fluid is seen within the pelvic cul-de-sac. IMPRESSION: 1. No intrauterine gestational sac seen. No evidence for ectopic pregnancy at this time. No quantitative beta HCG level is yet available. Would correlate with quantitative beta HCG levels, and consider follow-up pelvic ultrasound in 2 weeks as deemed clinically appropriate. 2. 2.9 cm fibroid at the posterior aspect of the uterine body. Electronically Signed   By:  Roanna Raider M.D.   On: 09/09/2015 23:24     Final diagnoses:  Early stage of pregnancy   I personally performed the services described in this documentation, which was scribed in my presence. The recorded information has been reviewed and is accurate.     Paula Libra, MD 09/10/15 7425  Paula Libra, MD 09/10/15 9563

## 2015-09-11 LAB — GC/CHLAMYDIA PROBE AMP (~~LOC~~) NOT AT ARMC
CHLAMYDIA, DNA PROBE: POSITIVE — AB
NEISSERIA GONORRHEA: NEGATIVE

## 2015-09-12 ENCOUNTER — Telehealth (HOSPITAL_BASED_OUTPATIENT_CLINIC_OR_DEPARTMENT_OTHER): Payer: Self-pay | Admitting: Emergency Medicine

## 2015-09-12 NOTE — Telephone Encounter (Signed)
Chart handoff to EDP for treatment plan for + Chlamydia,+ pregnancy

## 2015-09-13 ENCOUNTER — Telehealth: Payer: Self-pay | Admitting: *Deleted

## 2015-09-13 NOTE — ED Notes (Signed)
Spoke with patient, verified ID, informed of labs,  DHHS form faxed, patient informed to abstain for sexual activity x 10 days and notify sexual partners for evaluation and treatment.  Zithromax 1 gram x 1 dose [facility] [place] [phone]

## 2015-09-17 ENCOUNTER — Encounter (HOSPITAL_BASED_OUTPATIENT_CLINIC_OR_DEPARTMENT_OTHER): Payer: Self-pay | Admitting: *Deleted

## 2015-09-17 NOTE — MAU Note (Signed)
PT presented to MAU for pregnancy verification letter. Was evaluated at [facility] yesterday

## 2015-10-04 LAB — OB RESULTS CONSOLE ABO/RH: RH Type: POSITIVE

## 2015-10-04 LAB — OB RESULTS CONSOLE GC/CHLAMYDIA
CHLAMYDIA, DNA PROBE: NEGATIVE
GC PROBE AMP, GENITAL: NEGATIVE

## 2015-10-04 LAB — OB RESULTS CONSOLE RPR: RPR: NONREACTIVE

## 2015-10-04 LAB — OB RESULTS CONSOLE ANTIBODY SCREEN: ANTIBODY SCREEN: NEGATIVE

## 2015-10-04 LAB — OB RESULTS CONSOLE HEPATITIS B SURFACE ANTIGEN: HEP B S AG: NEGATIVE

## 2015-10-04 LAB — OB RESULTS CONSOLE RUBELLA ANTIBODY, IGM: Rubella: IMMUNE

## 2015-10-04 LAB — OB RESULTS CONSOLE HIV ANTIBODY (ROUTINE TESTING): HIV: NONREACTIVE

## 2015-10-14 ENCOUNTER — Inpatient Hospital Stay (HOSPITAL_COMMUNITY)
Admission: AD | Admit: 2015-10-14 | Discharge: 2015-10-14 | Disposition: A | Discharge status: Home / Self Care | Payer: Medicaid Other | Source: Ambulatory Visit | Attending: Obstetrics and Gynecology | Admitting: Obstetrics and Gynecology

## 2015-10-14 ENCOUNTER — Inpatient Hospital Stay (HOSPITAL_COMMUNITY): Payer: Medicaid Other

## 2015-10-14 ENCOUNTER — Encounter (HOSPITAL_COMMUNITY): Payer: Self-pay | Admitting: Medical

## 2015-10-14 DIAGNOSIS — O9989 Other specified diseases and conditions complicating pregnancy, childbirth and the puerperium: Secondary | ICD-10-CM | POA: Diagnosis not present

## 2015-10-14 DIAGNOSIS — O26899 Other specified pregnancy related conditions, unspecified trimester: Secondary | ICD-10-CM

## 2015-10-14 DIAGNOSIS — O26891 Other specified pregnancy related conditions, first trimester: Secondary | ICD-10-CM | POA: Diagnosis not present

## 2015-10-14 DIAGNOSIS — R109 Unspecified abdominal pain: Secondary | ICD-10-CM | POA: Insufficient documentation

## 2015-10-14 DIAGNOSIS — Z3A09 9 weeks gestation of pregnancy: Secondary | ICD-10-CM | POA: Diagnosis not present

## 2015-10-14 LAB — URINALYSIS, ROUTINE W REFLEX MICROSCOPIC
BILIRUBIN URINE: NEGATIVE
GLUCOSE, UA: NEGATIVE mg/dL
HGB URINE DIPSTICK: NEGATIVE
Ketones, ur: NEGATIVE mg/dL
Nitrite: NEGATIVE
PH: 5.5 (ref 5.0–8.0)
Protein, ur: NEGATIVE mg/dL
SPECIFIC GRAVITY, URINE: 1.02 (ref 1.005–1.030)

## 2015-10-14 LAB — WET PREP, GENITAL
Clue Cells Wet Prep HPF POC: NONE SEEN
SPERM: NONE SEEN
Trich, Wet Prep: NONE SEEN
Yeast Wet Prep HPF POC: NONE SEEN

## 2015-10-14 LAB — URINE MICROSCOPIC-ADD ON

## 2015-10-14 LAB — GC/CHLAMYDIA PROBE AMP (~~LOC~~) NOT AT ARMC
CHLAMYDIA, DNA PROBE: NEGATIVE
Neisseria Gonorrhea: NEGATIVE

## 2015-10-14 MED ORDER — IBUPROFEN 600 MG PO TABS
600.0000 mg | ORAL_TABLET | Freq: Once | ORAL | Status: AC
  Administered 2015-10-14: 600 mg via ORAL
  Filled 2015-10-14: qty 1

## 2015-10-14 NOTE — MAU Provider Note (Signed)
History     CSN: [id number]  Arrival date and time: 10/14/15 [id number]   First Provider Initiated Contact with Patient 10/14/15 0051      Chief Complaint  Patient presents with  . Abdominal Pain   HPI [name] is a 25 y.o. G2P1001 at 4329w4d who presents to MAU today with complaint of abdominal pain since yesterday. She states that pain comes and goes. She rates pain at 5/10 now and 8/10 at the worst. She took Tylenol at 1900 with minimal relief. She does endorse an increase in discharge over the last week, but denies vaginal bleeding, odor or UTI symptoms. She has had N/V x 3 today, but denies diarrhea or constipation. She was seen in the ER at the end of May and diagnosed with Chlamydia. She was treated, but states that her partner was not treated and she did have intercourse with condoms once since the diagnosis.    OB History    Gravida Para Term Preterm AB TAB SAB Ectopic Multiple Living   2 1 1       1       History reviewed. No pertinent past medical history.  History reviewed. No pertinent past surgical history.  History reviewed. No pertinent family history.  Social History  Substance Use Topics  . Smoking status: Never Smoker   . Smokeless tobacco: None  . Alcohol Use: No    Allergies: No Known Allergies  Prescriptions prior to admission  Medication Sig Dispense Refill Last Dose  . acetaminophen (TYLENOL) 500 MG tablet Take 1,000 mg by mouth every 6 (six) hours as needed. For pain    Past Month at Unknown  . ibuprofen (ADVIL,MOTRIN) 200 MG tablet Take 200 mg by mouth every 6 (six) hours as needed. pain    04/18/2011 at Unknown  . Prenatal Vit-Fe Fumarate-FA (GNP PRENATAL VITAMINS) 28-0.8 MG TABS Take 1 tablet by mouth daily. 30 tablet 0     Review of Systems  Constitutional: Negative for fever and malaise/fatigue.  Gastrointestinal: Positive for nausea, vomiting and abdominal pain. Negative for diarrhea and constipation.  Genitourinary: Negative for  dysuria, urgency and frequency.       Neg -vaginal bleeding + vaginal discharge   Physical Exam   Blood pressure 117/62, pulse 84, temperature 98.3 F (36.8 C), temperature source Oral, resp. rate 17, height 5' (1.524 m), weight 155 lb (70.308 kg), last menstrual period 06/13/2015, SpO2 100 %.  Physical Exam  Nursing note and vitals reviewed. Constitutional: She is oriented to person, place, and time. She appears well-developed and well-nourished. No distress.  HENT:  Head: Normocephalic and atraumatic.  Cardiovascular: Normal rate.   Respiratory: Effort normal.  GI: Soft. She exhibits no distension and no mass. There is tenderness (mild tenderness to palpation of the lower abdomen bilaterally). There is no rebound and no guarding.  Genitourinary: Uterus is enlarged. Uterus is not tender. Cervix exhibits no motion tenderness. No bleeding in the vagina. Vaginal discharge (small white) found.  Neurological: She is alert and oriented to person, place, and time.  Skin: Skin is warm and dry. No erythema.  Psychiatric: She has a normal mood and affect.  Dilation: Closed Effacement (%): Thick Cervical Position: Posterior Exam by:: Magnus SinningWenzel, PA   Results for orders placed or performed during the hospital encounter of 10/14/15 (from the past 24 hour(s))  Urinalysis, Routine w reflex microscopic (not at [facility])     Status: Abnormal   Collection Time: 10/14/15 12:27 AM  Result Value Ref  Range   Color, Urine YELLOW YELLOW   APPearance CLEAR CLEAR   Specific Gravity, Urine 1.020 1.005 - 1.030   pH 5.5 5.0 - 8.0   Glucose, UA NEGATIVE NEGATIVE mg/dL   Hgb urine dipstick NEGATIVE NEGATIVE   Bilirubin Urine NEGATIVE NEGATIVE   Ketones, ur NEGATIVE NEGATIVE mg/dL   Protein, ur NEGATIVE NEGATIVE mg/dL   Nitrite NEGATIVE NEGATIVE   Leukocytes, UA TRACE (A) NEGATIVE  Urine microscopic-add on     Status: Abnormal   Collection Time: 10/14/15 12:27 AM  Result Value Ref Range   Squamous Epithelial /  LPF 0-5 (A) NONE SEEN   WBC, UA 0-5 0 - 5 WBC/hpf   RBC / HPF 0-5 0 - 5 RBC/hpf   Bacteria, UA FEW (A) NONE SEEN  Wet prep, genital     Status: Abnormal   Collection Time: 10/14/15  1:05 AM  Result Value Ref Range   Yeast Wet Prep HPF POC NONE SEEN NONE SEEN   Trich, Wet Prep NONE SEEN NONE SEEN   Clue Cells Wet Prep HPF POC NONE SEEN NONE SEEN   WBC, Wet Prep HPF POC MODERATE (A) NONE SEEN   Sperm NONE SEEN    [place] Ob Comp Less 14 Wks  10/14/2015  CLINICAL DATA:  25 year old female with pelvic cramping EXAM: OBSTETRIC <14 WK ULTRASOUND TECHNIQUE: Transabdominal ultrasound was performed for evaluation of the gestation as well as the maternal uterus and adnexal regions. COMPARISON:  None. FINDINGS: Intrauterine gestational sac: Single intrauterine gestational sac Yolk sac:  Seen Embryo:  Present Cardiac Activity: Detected Heart Rate: 180 bpm CRL:   25  mm   9 w 1 d                  US EDC: 05/17/2016 Subchorionic hemorrhage:  None visualized. Maternal uterus/adnexae: The maternal ovaries appear unremarkable. No free fluid noted within the pelvis. IMPRESSION: Single live intrauterine pregnancy with an estimated gestational age of [redacted] weeks, 1 day. Electronically Signed   By: Elgie CollardArash  Radparvar M.D.   On: 10/14/2015 02:06     MAU Course  Procedures None  MDM [facility] - unable to obtain with doppler UA, wet prep, GC/Chlamydia and US today  Discussed patient with Dr. Dion BodyVarnado. 600 mg Ibuprofen once here. Wait on culture results for TOC for Chlamydia. Follow-up in the office as scheduled.   Assessment and Plan  A: SIUP at 6643w4d Abdominal pain in pregnancy, first trimester  P: Discharge home First trimester precautions discussed GC/Chlamydia pending Patient advised to [facility] as scheduled for routine prenatal care or sooner PRN Patient may return to MAU as needed or if her condition were to change or worsen   Marny LowensteinJulie N Wenzel, PA-C  10/14/2015, 2:50 AM

## 2015-10-14 NOTE — Discharge Instructions (Signed)
Primer trimestre de embarazo (First Trimester of Pregnancy) El primer trimestre de embarazo se extiende desde la semana1 hasta el final de la semana12 (mes1 al mes3). Durante este tiempo, el beb comenzar a desarrollarse dentro suyo. Entre la semana6 y la8, se forman los ojos y el rostro, y los latidos del corazn pueden verse en la ecografa. Al final de las 12semanas, todos los rganos del beb estn formados. La atencin prenatal es toda la asistencia mdica que usted recibe antes del nacimiento del beb. Asegrese de recibir una buena atencin prenatal y de seguir todas las indicaciones del mdico. CUIDADOS EN EL HOGAR  Medicamentos:  Tome los medicamentos solamente como se lo haya indicado el mdico. Algunos medicamentos se pueden tomar durante el embarazo y otros no.  Tome las vitaminas prenatales como se lo haya indicado el mdico.  Tome el medicamento que la ayuda a defecar (laxante suave) segn sea necesario, si el mdico lo autoriza. Dieta  Ingiera alimentos saludables de manera regular.  El mdico le indicar la cantidad de peso que puede aumentar.  No coma carne cruda ni quesos sin cocinar.  Si tiene malestar estomacal (nuseas) o vomita:  Ingiera 4 o 5comidas pequeas por da en lugar de 3abundantes.  Intente comer algunas galletitas saladas.  Beba lquidos entre las comidas, en lugar de hacerlo durante estas.  Si tiene dificultad para defecar (estreimiento):  Consuma alimentos con alto contenido de fibra, como verduras y frutas frescos, y cereales integrales.  Beba suficiente lquido para mantener el pis (orina) claro o de color amarillo plido. Actividad y ejercicios  Haga ejercicios solamente como se lo haya indicado el mdico. Deje de hacer ejercicios si tiene clicos o dolor en la parte baja del vientre (abdomen) o en la cintura.  Intente no estar de pie durante mucho tiempo. Mueva las piernas con frecuencia si debe estar de pie en un lugar durante  mucho tiempo.  Evite levantar pesos excesivos.  Use zapatos con tacones bajos. Mantenga una buena postura al sentarse y pararse.  Puede tener relaciones sexuales, a menos que el mdico le indique lo contrario. Alivio del dolor o las molestias  Use un sostn que le brinde buen soporte si le duelen las mamas.  Dese baos con agua tibia (baos de asiento) para aliviar el dolor o las molestias a causa de las hemorroides. Use crema antihemorroidal si el mdico se lo permite.  Descanse con las piernas elevadas si tiene calambres o dolor de cintura.  Use medias de descanso si tiene las venas de las piernas hinchadas y abultadas (venas varicosas). Eleve los pies durante 15minutos, 3 o 4veces por da. Limite la cantidad de sal en su dieta. Cuidados prenatales  Programe las visitas prenatales para la semana12 de embarazo.  Escriba sus preguntas. Llvelas cuando concurra a las visitas prenatales.  Concurra a todas las visitas prenatales como se lo haya indicado el mdico. Seguridad  Colquese el cinturn de seguridad cuando conduzca.  Haga una lista con los nmeros de telfono en caso de emergencia, en la cual deben incluirse los nmeros de los familiares, los amigos, el hospital y los departamentos de polica y de bomberos. Consejos generales  Pdale al mdico que la derive a clases prenatales en su localidad. Debe comenzar a tomar las clases antes de entrar en el mes6 de embarazo.  Pida ayuda si necesita asesoramiento o asistencia con la alimentacin. El mdico puede aconsejarla o indicarle dnde recurrir para recibir ayuda.  No se d baos de inmersin en agua caliente,   baos turcos ni saunas.  No se haga duchas vaginales ni use tampones o toallas higinicas perfumadas.  No mantenga las piernas cruzadas durante [place] tiempo.  Evite el contacto con las bandejas sanitarias de los gatos y la tierra que estos animales usan.  No fume, no consuma hierbas ni beba alcohol. No tome  frmacos que el mdico no haya autorizado.  No consuma ningn producto que contenga tabaco, lo que incluye cigarrillos, tabaco de Theatre managermascar o Administrator, Civil Servicecigarrillos electrnicos. Si necesita ayuda para dejar de fumar, consulte al American Expressmdico. Puede recibir asesoramiento u otro tipo de apoyo para dejar de fumar.  Visite al dentista. En su casa, lvese los dientes con un cepillo dental suave. Psese el hilo dental con suavidad. SOLICITE AYUDA SI:  Tiene mareos.  Tiene clicos leves o siente presin en la parte baja del vientre.  Siente un dolor persistente en la zona del vientre.  Sigue teniendo AT&Tmalestar estomacal, vomita o las heces son lquidas (diarrea).  Observa una secrecin, con mal olor que proviene de la vagina.  Siente dolor al ConocoPhillipsorinar.  Tiene el rostro, las [place] Do[place]manos, las piernas o los tobillos ms hinchados (inflamados). SOLICITE AYUDA DE INMEDIATO SI:   Tiene fiebre.  Tiene una prdida de lquido por la vagina.  Tiene sangrado o pequeas prdidas vaginales.  Tiene clicos o dolor muy intensos en el vientre.  Sube o baja de peso rpidamente.  Vomita sangre. Puede ser similar a la borra del caf  Est en contacto con personas que tienen rubola, la quinta enfermedad o varicela.  Siente un dolor de cabeza muy intenso.  Le falta el aire.  Sufre cualquier tipo de traumatismo, por ejemplo, debido a una cada o un accidente automovilstico.   Esta informacin no tiene Theme park managercomo fin reemplazar el consejo del mdico. Asegrese de hacerle al mdico cualquier pregunta que tenga.   Document Released: 07/03/2008 Document Revised: 04/27/2014 Elsevier Interactive Patient Education [employer]2016 Elsevier Inc.

## 2015-10-14 NOTE — MAU Note (Signed)
Pt reports really sharp lower abd pain and lower abd cramping today. Denies bleeding.

## 2016-02-13 ENCOUNTER — Inpatient Hospital Stay (HOSPITAL_COMMUNITY)
Admission: AD | Admit: 2016-02-13 | Discharge: 2016-02-13 | Disposition: A | Discharge status: Home / Self Care | Payer: Medicaid Other | Source: Ambulatory Visit | Attending: Obstetrics and Gynecology | Admitting: Obstetrics and Gynecology

## 2016-02-13 ENCOUNTER — Encounter (HOSPITAL_COMMUNITY): Payer: Self-pay

## 2016-02-13 DIAGNOSIS — O9989 Other specified diseases and conditions complicating pregnancy, childbirth and the puerperium: Secondary | ICD-10-CM | POA: Diagnosis not present

## 2016-02-13 DIAGNOSIS — O26892 Other specified pregnancy related conditions, second trimester: Secondary | ICD-10-CM | POA: Insufficient documentation

## 2016-02-13 DIAGNOSIS — Z3A27 27 weeks gestation of pregnancy: Secondary | ICD-10-CM | POA: Insufficient documentation

## 2016-02-13 DIAGNOSIS — M79604 Pain in right leg: Secondary | ICD-10-CM

## 2016-02-13 DIAGNOSIS — M79651 Pain in right thigh: Secondary | ICD-10-CM | POA: Diagnosis present

## 2016-02-13 HISTORY — DX: Gestational diabetes mellitus in pregnancy, unspecified control: O24.419

## 2016-02-13 NOTE — MAU Provider Note (Signed)
Chief Complaint:  Right posterior thigh pain   First Provider Initiated Contact with Patient 02/13/16 1408     HPI: Kathryn Cruz is a 25 y.o. G2P1001 at 13w0dwho presents to maternity admissions reporting Right posterior thigh pain when walking which radiates to lower abdomin, only when walking.  Does not hurt at all when sitting or lying down. She reports good fetal movement, denies LOF, vaginal bleeding, vaginal itching/burning, urinary symptoms, h/a, dizziness, n/v, diarrhea, constipation or fever/chills.  She denies headache, visual changes or RUQ abdominal pain.  Abdominal Pain  This is a new problem. The current episode started 1 to 4 weeks ago. The onset quality is gradual. The problem occurs intermittently. The problem has been waxing and waning. Pain location: Thigh pain radiates into lower abdomen only when walking. The pain is mild. The quality of the pain is aching. Pain radiation: Radiates FROM right post thigh. Pertinent negatives include no constipation, diarrhea, dysuria, fever, headaches, nausea or vomiting. The pain is aggravated by movement. The pain is relieved by nothing. She has tried acetaminophen for the symptoms. The treatment provided no relief.   Past Medical History: Gestational Diabetes  Past obstetric history: OB History  Gravida Para Term Preterm AB Living  2 1 1     1   SAB TAB Ectopic Multiple Live Births          1    # Outcome Date GA Lbr Len/2nd Weight Sex Delivery Anes PTL Lv  2 Current           1 Term      Vag-Spont   LIV      Past Surgical History: No past surgical history on file.  Family History: No family history on file.  Social History: Social History  Substance Use Topics  . Smoking status: Never Smoker  . Smokeless tobacco: Not on file  . Alcohol use No    Allergies: No Known Allergies  Meds:  Prescriptions Prior to Admission  Medication Sig Dispense Refill Last Dose  . acetaminophen (TYLENOL) 500 MG tablet Take 1,000 mg by  mouth every 6 (six) hours as needed. For pain    Past Month at Unknown  . Prenatal Vit-Fe Fumarate-FA (GNP PRENATAL VITAMINS) 28-0.8 MG TABS Take 1 tablet by mouth daily. 30 tablet 0     I have reviewed patient's Past Medical Hx, Surgical Hx, Family Hx, Social Hx, medications and allergies.   ROS:  Review of Systems  Constitutional: Negative for fever.  Gastrointestinal: Positive for abdominal pain. Negative for constipation, diarrhea, nausea and vomiting.  Genitourinary: Positive for pelvic pain. Negative for dysuria, flank pain and vaginal discharge.  Neurological: Negative for dizziness and headaches.   Other systems negative  Physical Exam  No data found.  Constitutional: Well-developed, well-nourished female in no acute distress.  Cardiovascular: normal rate and rhythm Respiratory: normal effort, clear to auscultation bilaterally GI: Abd soft, non-tender, gravid appropriate for gestational age.   No rebound or guarding. MS: Extremities nontender, no edema, normal ROM Neurologic: Alert and oriented x 4.  GU: Neg CVAT.  PELVIC EXAM: Cervix pink, visually closed, without lesion, scant white creamy discharge, vaginal walls and external genitalia normal Bimanual exam: Cervix firm, posterior, neg CMT, uterus nontender, Fundal Height consistent with dates, adnexa without tenderness, enlargement, or mass   FHT:  Baseline 145 , moderate variability, small accelerations present, no decelerations Contractions: none   Labs: No results found for this or any previous visit (from the past 72 hour(s)).  Imaging:  No results found.  MAU Course/MDM: I have ordered labs and reviewed results.  NST reviewed Consult Dr Jefferson FuelVarmadp with presentation, exam findings and test results. She recommends referral to Physical Therapy Treatments in MAU included EFM.    Assessment: Right leg pain  Plan: Discharge home Office will refer to PT  Tylenol PRN Preterm Labor precautions and fetal kick  counts Follow up in Office for prenatal visits and recheck of status  Encouraged to return here or to other Urgent Care/ED if she develops worsening of symptoms, increase in pain, fever, or other concerning symptoms.   Pt stable at time of discharge.  [name] CNM, MSN Certified Nurse-Midwife 02/13/2016 2:08 PM

## 2016-02-13 NOTE — MAU Note (Signed)
Pt presents to MAU with right leg pain that radiates to the abdomen. Pain started about a month ago but became much worse within the last week. Taking tylenol for pain, without relief

## 2016-02-13 NOTE — Discharge Instructions (Signed)
Muscle Pain, Adult  Muscle pain (myalgia) may be caused by many things, including:  · Overuse or muscle strain, especially if you are not in shape. This is the most common cause of muscle pain.  · Injury.  · Bruises.  · Viruses, such as the flu.  · Infectious diseases.  · Fibromyalgia, which is a chronic condition that causes muscle tenderness, fatigue, and headache.  · Autoimmune diseases, including lupus.  · Certain drugs, including ACE inhibitors and statins.  Muscle pain may be mild or severe. In most cases, the pain lasts only a short time and goes away without treatment. To diagnose the cause of your muscle pain, your health care provider will take your medical history. This means he or she will ask you when your muscle pain began and what has been happening. If you have not had muscle pain for very long, your health care provider may want to wait before doing much testing. If your muscle pain has lasted a long time, your health care provider may want to run tests right away. If your health care provider thinks your muscle pain may be caused by illness, you may need to have additional tests to rule out certain conditions.   Treatment for muscle pain depends on the cause. Home care is often enough to relieve muscle pain. Your health care provider may also prescribe anti-inflammatory medicine.  HOME CARE INSTRUCTIONS  Watch your condition for any changes. The following actions may help to lessen any discomfort you are feeling:  · Only take over-the-counter or prescription medicines as directed by your health care provider.  · Apply ice to the sore muscle:    Put ice in a plastic bag.    Place a towel between your skin and the bag.    Leave the ice on for 15-20 minutes, 3-4 times a day.  · You may alternate applying hot and cold packs to the muscle as directed by your health care provider.  · If overuse is causing your muscle pain, slow down your activities until the pain goes away.    Remember that it is normal  to feel some muscle pain after starting a workout program. Muscles that have not been used often will be sore at first.    Do regular, gentle exercises if you are not usually active.    Warm up before exercising to lower your risk of muscle pain.  · Do not continue working out if the pain is very bad. Bad pain could mean you have injured a muscle.  SEEK MEDICAL CARE IF:  · Your muscle pain gets worse, and medicines do not help.  · You have muscle pain that lasts longer than 3 days.  · You have a rash or fever along with muscle pain.  · You have muscle pain after a tick bite.  · You have muscle pain while working out, even though you are in good physical condition.  · You have redness, soreness, or swelling along with muscle pain.  · You have muscle pain after starting a new medicine or changing the dose of a medicine.  SEEK IMMEDIATE MEDICAL CARE IF:  · You have trouble breathing.  · You have trouble swallowing.  · You have muscle pain along with a stiff neck, fever, and vomiting.  · You have severe muscle weakness or cannot move part of your body.  MAKE SURE YOU:   · Understand these instructions.  · Will watch your condition.  · Will get   help right away if you are not doing well or get worse.     This information is not intended to replace advice given to you by your health care provider. Make sure you discuss any questions you have with your health care provider.     Document Released: 02/26/2006 Document Revised: 04/27/2014 Document Reviewed: 01/31/2013  Elsevier Interactive Patient Education ©2016 Elsevier Inc.

## 2016-02-24 ENCOUNTER — Encounter: Payer: Medicaid Other | Attending: Obstetrics and Gynecology | Admitting: Skilled Nursing Facility1

## 2016-02-24 DIAGNOSIS — O99810 Abnormal glucose complicating pregnancy: Secondary | ICD-10-CM | POA: Insufficient documentation

## 2016-02-24 DIAGNOSIS — Z3A00 Weeks of gestation of pregnancy not specified: Secondary | ICD-10-CM | POA: Insufficient documentation

## 2016-02-25 ENCOUNTER — Encounter: Payer: Self-pay | Admitting: Skilled Nursing Facility1

## 2016-02-25 NOTE — Progress Notes (Signed)
  Patient was seen on 02/24/2016 for Gestational Diabetes self-management class at the [facility]. The following learning objectives were met by the patient during this course:   States the definition of Gestational Diabetes  States why dietary management is important in controlling blood glucose  Describes the effects each nutrient has on blood glucose levels  Demonstrates ability to create a balanced meal plan  Demonstrates carbohydrate counting   States when to check blood glucose levels involving a total of 4 separate occurences in a day  Demonstrates proper blood glucose monitoring techniques  States the effect of stress and exercise on blood glucose levels  States the importance of limiting caffeine and abstaining from alcohol and smoking  Demonstrates the knowledge the glucometer provided in class may not be covered by their insurance and to call their insurance provider immediately after class to know which glucometer their insurance provider does cover as well as calling their physician the next day for a prescription to the glucometer their insurance does cover (if the one provided is not) as well as the lancets and strips for that meter.  Blood glucose monitor given: pt a;ready had her own Blood glucose reading: 109  Patient instructed to monitor glucose levels: FBS: 60 - <90 1 hour: <140 2 hour: <120  *Patient received handouts:  Nutrition Diabetes and Pregnancy  Carbohydrate Counting List  Patient will be seen for follow-up as needed.

## 2016-03-03 ENCOUNTER — Encounter: Payer: Self-pay | Admitting: Obstetrics and Gynecology

## 2016-03-30 ENCOUNTER — Ambulatory Visit: Payer: Medicaid Other | Attending: Obstetrics and Gynecology | Admitting: Physical Therapy

## 2016-03-30 DIAGNOSIS — M5441 Lumbago with sciatica, right side: Secondary | ICD-10-CM | POA: Diagnosis not present

## 2016-03-30 DIAGNOSIS — M6281 Muscle weakness (generalized): Secondary | ICD-10-CM | POA: Insufficient documentation

## 2016-03-31 ENCOUNTER — Encounter: Payer: Self-pay | Admitting: Physical Therapy

## 2016-03-31 NOTE — Therapy (Signed)
[facility] [address] [place], [place], [id number] Phone: [phone]   Fax:  [phone]  Physical Therapy Evaluation  Patient Details  Name: Kathryn Cruz MRN: [id number] Date of Birth: Sep 10, 1990 Referring Provider: Dr Geryl RankinsEvelyn Varnado   Encounter Date: 03/30/2016      PT End of Session - 03/31/16 0828    Visit Number 1   Number of Visits 16   Date for PT Re-Evaluation 05/26/16   Authorization Type Medicaid    PT Start Time 0345   PT Stop Time 0432   PT Time Calculation (min) 47 min   Activity Tolerance Patient tolerated treatment well   Behavior [facility] for tasks assessed/performed      Past Medical History:  Diagnosis Date  . Gestational diabetes    diet controlled    Past Surgical History:  Procedure Laterality Date  . WISDOM TOOTH EXTRACTION      There were no vitals filed for this visit.       Subjective Assessment - 03/31/16 0820    Subjective Patient is a 25 year old female with low back pain. She is 34 weeks present. Prior to pregnancy she had lower back pain at times but never had radicular pain. She is having pain down her right leg at this time. She has more pain whren she walks  and drives. Shehas to drive and walk long distances at work.    Limitations Standing;Walking   How long can you sit comfortably? No limit    How long can you stand comfortably? < 10 min    How long can you walk comfortably? 200 feet before she has pain    Diagnostic tests Nothing taken 2nd to pregnency    Patient Stated Goals To have less back pain    Currently in Pain? Yes   Pain Score 7    Pain Location Back   Pain Orientation Lower   Pain Descriptors / Indicators Aching   Pain Type Acute pain   Pain Radiating Towards pain radiating into her right foot    Pain Onset More than a month ago   Pain Frequency Constant   Aggravating Factors  walking, running    Pain Relieving Factors rest   Effect of  Pain on Daily Activities pain when walking             [facility] - 03/31/16 0001      Assessment   Medical Diagnosis Low back pain with sciatica    Referring Provider Dr Geryl RankinsEvelyn Varnado    Onset Date/Surgical Date --  30 weeks prior   Hand Dominance Right   Next MD Visit Next month    Prior Therapy none      Precautions   Precaution Comments Pregnecy      Restrictions   Other Position/Activity Restrictions n     Balance Screen   Is the patient reluctant to leave their home because of a fear of falling?  No     Home Environment   Additional Comments Lives at home. Has a 27 year old child      Prior Function   Level of Independence Independent   Vocation Full time employment   Multimedia programmerVocation Requirements Manager; large amounts of walking and driving    Leisure Nothing reported     Cognition   Overall Cognitive Status Within Functional Limits for tasks assessed   Attention Focused   Focused Attention Appears intact   Memory Appears intact  Awareness Appears intact   Problem Solving Appears intact     Observation/Other Assessments   Observations Pregnant, rounded shoulders    Focus on Therapeutic Outcomes (FOTO)  1x visit      Sensation   Additional Comments radicualr pain running down right leg      Coordination   Gross Motor Movements are Fluid and Coordinated Yes   Fine Motor Movements are Fluid and Coordinated Yes     Posture/Postural Control   Posture Comments rounded shoulder, forward head      ROM / Strength   AROM / PROM / Strength PROM;AROM;Strength     AROM   Overall AROM Comments Lumbar active ROM limited by the patients abdomen       PROM   Overall PROM Comments Hip flexion limited by patients abdomen      Strength   Overall Strength Comments L LE 5/5    Strength Assessment Site Hip   Right/Left Hip Right   Right Hip Flexion 4/5   Right Hip ABduction 4/5   Right Hip ADduction 4/5     Palpation   Palpation comment spasming on the  right side of lumbar paraspinals                    [facility] Adult PT Treatment/Exercise - 03/31/16 0001      Exercises   Exercises Other Exercises   Other Exercises  Patient givcen sheet with lumbar stretches and light strengthening for pregnency.                 PT Education - 03/30/16 1632    Education provided Yes   Education Details education on SI belt and lumbar roll, Information on positioning. Patient given packet of information on pregnency    Person(s) Educated Patient   Methods Explanation;Demonstration   Comprehension Verbalized understanding;Returned demonstration;Tactile cues required          PT Short Term Goals - 03/31/16 0837      PT SHORT TERM GOAL #1   Title Patient will be independent with HEP    Time 1   Period Weeks   Status Achieved     PT SHORT TERM GOAL #2   Title Patient will demonstrate proper don/doffing SI belt   Time 1   Period Weeks   Status Achieved                  Plan - 03/31/16 0830    Clinical Impression Statement Patient is a 25 year old female who presents with right sided lumbar pain with sciatica. She has limited lumbar movement and right hip and knee weakness. She was given a program of movements and light strengthening and a packet of do's and dont's for back pain with pregnency. She will only be seen 1x per week medicid restrictions.    Rehab Potential Good   PT Frequency 2x / week   PT Duration 8 weeks   PT Treatment/Interventions Functional mobility training;Patient/family education;Therapeutic exercise;Therapeutic activities   PT Next Visit [facility]    PT Home Exercise Plan Patient given a pacekt of prenency information    Consulted and Agree with Plan of Care Patient      Patient will benefit from skilled therapeutic intervention in order to improve the following deficits and impairments:     Visit Diagnosis: Acute right-sided low back pain with right-sided sciatica -  Plan: PT plan of care cert/re-cert  Muscle weakness (generalized) - Plan: PT  plan of care cert/re-cert     Problem List There are no active problems to display for this patient.   Dessie Coma PT DPT  03/31/2016, 8:45 AM  [facility] [address] [place], [place], [address] Phone: [phone]   Fax:  [phone]  Name: Kathryn Cruz MRN: [id number] Date of Birth: July 11, 1990

## 2016-04-14 LAB — OB RESULTS CONSOLE GBS: STREP GROUP B AG: NEGATIVE

## 2016-04-20 NOTE — L&D Delivery Note (Signed)
Delivery Note At 11:43 AM a viable female was delivered via Vaginal, Spontaneous Delivery (Presentation:vertex)  APGAR: 9, 9; weight pending.   Placenta status: L&D.  Pt felt pressure to push and pushed voluntarily.  Kathryn Cruz,CNM was present for her imminent delivery.  I arrived for delivery of placenta.  1st degree laceration repaired in the usual fashion using 3-0 vicryl.  Anesthesia:  local Episiotomy: None Lacerations:  1st degree Suture Repair: 3.0 vicryl Est. Blood Loss (mL):  250cc  Mom to postpartum.  Baby to Couplet care / Skin to Skin.  Myna HidalgoZAN, Young Mulvey, M 05/08/2016, 12:50 PM

## 2016-04-28 ENCOUNTER — Encounter (HOSPITAL_COMMUNITY): Payer: Self-pay | Admitting: *Deleted

## 2016-04-28 ENCOUNTER — Telehealth (HOSPITAL_COMMUNITY): Payer: Self-pay | Admitting: *Deleted

## 2016-04-28 NOTE — Telephone Encounter (Signed)
Preadmission screen  

## 2016-05-04 ENCOUNTER — Encounter (HOSPITAL_COMMUNITY): Payer: Self-pay | Admitting: *Deleted

## 2016-05-04 NOTE — Telephone Encounter (Signed)
[phone] interpreter number

## 2016-05-07 ENCOUNTER — Inpatient Hospital Stay (HOSPITAL_COMMUNITY): Admission: RE | Admit: 2016-05-07 | Payer: 59 | Source: Ambulatory Visit

## 2016-05-07 ENCOUNTER — Encounter (HOSPITAL_COMMUNITY): Payer: 59

## 2016-05-08 ENCOUNTER — Inpatient Hospital Stay (HOSPITAL_COMMUNITY)
Admission: AD | Admit: 2016-05-08 | Discharge: 2016-05-10 | DRG: 775 | Disposition: A | Discharge status: Home / Self Care | Payer: Medicaid Other | Source: Ambulatory Visit | Attending: Obstetrics & Gynecology | Admitting: Obstetrics & Gynecology

## 2016-05-08 ENCOUNTER — Encounter (HOSPITAL_COMMUNITY): Payer: Self-pay | Admitting: *Deleted

## 2016-05-08 DIAGNOSIS — Z3493 Encounter for supervision of normal pregnancy, unspecified, third trimester: Secondary | ICD-10-CM | POA: Diagnosis present

## 2016-05-08 DIAGNOSIS — O24425 Gestational diabetes mellitus in childbirth, controlled by oral hypoglycemic drugs: Secondary | ICD-10-CM | POA: Diagnosis present

## 2016-05-08 DIAGNOSIS — Z3A39 39 weeks gestation of pregnancy: Secondary | ICD-10-CM

## 2016-05-08 HISTORY — DX: Unspecified infectious disease: B99.9

## 2016-05-08 LAB — TYPE AND SCREEN
ABO/RH(D): A POS
ANTIBODY SCREEN: NEGATIVE

## 2016-05-08 LAB — CBC
HCT: 37.8 % (ref 36.0–46.0)
Hemoglobin: 12.4 g/dL (ref 12.0–15.0)
MCH: 25.8 pg — AB (ref 26.0–34.0)
MCHC: 32.8 g/dL (ref 30.0–36.0)
MCV: 78.8 fL (ref 78.0–100.0)
PLATELETS: 251 10*3/uL (ref 150–400)
RBC: 4.8 MIL/uL (ref 3.87–5.11)
RDW: 14.9 % (ref 11.5–15.5)
WBC: 10.1 10*3/uL (ref 4.0–10.5)

## 2016-05-08 LAB — GLUCOSE, CAPILLARY: GLUCOSE-CAPILLARY: 83 mg/dL (ref 65–99)

## 2016-05-08 LAB — ABO/RH: ABO/RH(D): A POS

## 2016-05-08 MED ORDER — IBUPROFEN 600 MG PO TABS
600.0000 mg | ORAL_TABLET | Freq: Four times a day (QID) | ORAL | Status: DC
  Administered 2016-05-08 – 2016-05-10 (×8): 600 mg via ORAL
  Filled 2016-05-08 (×8): qty 1

## 2016-05-08 MED ORDER — PSEUDOEPHEDRINE HCL 30 MG PO TABS
60.0000 mg | ORAL_TABLET | Freq: Four times a day (QID) | ORAL | Status: DC | PRN
  Administered 2016-05-08: 60 mg via ORAL
  Filled 2016-05-08: qty 2

## 2016-05-08 MED ORDER — SOD CITRATE-CITRIC ACID 500-334 MG/5ML PO SOLN: 30.0000 mL | ORAL | Status: DC | PRN

## 2016-05-08 MED ORDER — ONDANSETRON HCL 4 MG/2ML IJ SOLN: 4.0000 mg | INTRAMUSCULAR | Status: DC | PRN

## 2016-05-08 MED ORDER — BENZOCAINE-MENTHOL 20-0.5 % EX AERO
1.0000 "application " | INHALATION_SPRAY | CUTANEOUS | Status: DC | PRN
  Administered 2016-05-08: 1 via TOPICAL
  Filled 2016-05-08: qty 56

## 2016-05-08 MED ORDER — ACETAMINOPHEN 325 MG PO TABS
650.0000 mg | ORAL_TABLET | ORAL | Status: DC | PRN
  Administered 2016-05-08 – 2016-05-09 (×3): 650 mg via ORAL
  Filled 2016-05-08 (×3): qty 2

## 2016-05-08 MED ORDER — OXYTOCIN BOLUS FROM INFUSION
500.0000 mL | Freq: Once | INTRAVENOUS | Status: AC
  Administered 2016-05-08: 500 mL via INTRAVENOUS

## 2016-05-08 MED ORDER — SENNOSIDES-DOCUSATE SODIUM 8.6-50 MG PO TABS
2.0000 | ORAL_TABLET | ORAL | Status: DC
  Administered 2016-05-08 – 2016-05-09 (×2): 2 via ORAL
  Filled 2016-05-08 (×2): qty 2

## 2016-05-08 MED ORDER — LIDOCAINE HCL (PF) 1 % IJ SOLN
30.0000 mL | INTRAMUSCULAR | Status: AC | PRN
  Administered 2016-05-08: 30 mL via SUBCUTANEOUS
  Filled 2016-05-08: qty 30

## 2016-05-08 MED ORDER — WITCH HAZEL-GLYCERIN EX PADS: 1.0000 "application " | MEDICATED_PAD | CUTANEOUS | Status: DC | PRN

## 2016-05-08 MED ORDER — TETANUS-DIPHTH-ACELL PERTUSSIS 5-2.5-18.5 LF-MCG/0.5 IM SUSP: 0.5000 mL | Freq: Once | INTRAMUSCULAR | Status: DC

## 2016-05-08 MED ORDER — FLEET ENEMA 7-19 GM/118ML RE ENEM: 1.0000 | ENEMA | RECTAL | Status: DC | PRN

## 2016-05-08 MED ORDER — DIPHENHYDRAMINE HCL 25 MG PO CAPS: 25.0000 mg | ORAL_CAPSULE | Freq: Four times a day (QID) | ORAL | Status: DC | PRN

## 2016-05-08 MED ORDER — DIBUCAINE 1 % RE OINT: 1.0000 "application " | TOPICAL_OINTMENT | RECTAL | Status: DC | PRN

## 2016-05-08 MED ORDER — OXYCODONE-ACETAMINOPHEN 5-325 MG PO TABS: 2.0000 | ORAL_TABLET | ORAL | Status: DC | PRN

## 2016-05-08 MED ORDER — FENTANYL CITRATE (PF) 100 MCG/2ML IJ SOLN
100.0000 ug | Freq: Once | INTRAMUSCULAR | Status: AC
  Administered 2016-05-08: 100 ug via INTRAVENOUS
  Filled 2016-05-08: qty 2

## 2016-05-08 MED ORDER — PRENATAL MULTIVITAMIN CH
1.0000 | ORAL_TABLET | Freq: Every day | ORAL | Status: DC
  Administered 2016-05-08 – 2016-05-09 (×2): 1 via ORAL
  Filled 2016-05-08 (×2): qty 1

## 2016-05-08 MED ORDER — ONDANSETRON HCL 4 MG/2ML IJ SOLN: 4.0000 mg | Freq: Four times a day (QID) | INTRAMUSCULAR | Status: DC | PRN

## 2016-05-08 MED ORDER — OXYCODONE-ACETAMINOPHEN 5-325 MG PO TABS: 1.0000 | ORAL_TABLET | ORAL | Status: DC | PRN

## 2016-05-08 MED ORDER — ACETAMINOPHEN 325 MG PO TABS: 650.0000 mg | ORAL_TABLET | ORAL | Status: DC | PRN

## 2016-05-08 MED ORDER — ONDANSETRON HCL 4 MG PO TABS: 4.0000 mg | ORAL_TABLET | ORAL | Status: DC | PRN

## 2016-05-08 MED ORDER — LACTATED RINGERS IV SOLN
INTRAVENOUS | Status: DC
  Administered 2016-05-08: 09:00:00 via INTRAVENOUS

## 2016-05-08 MED ORDER — OXYTOCIN 40 UNITS IN LACTATED RINGERS INFUSION - SIMPLE MED
2.5000 [IU]/h | INTRAVENOUS | Status: DC
  Filled 2016-05-08: qty 1000

## 2016-05-08 MED ORDER — COCONUT OIL OIL: 1.0000 "application " | TOPICAL_OIL | Status: DC | PRN

## 2016-05-08 MED ORDER — LACTATED RINGERS IV SOLN: 500.0000 mL | INTRAVENOUS | Status: DC | PRN

## 2016-05-08 MED ORDER — SIMETHICONE 80 MG PO CHEW: 80.0000 mg | CHEWABLE_TABLET | ORAL | Status: DC | PRN

## 2016-05-08 MED ORDER — ZOLPIDEM TARTRATE 5 MG PO TABS: 5.0000 mg | ORAL_TABLET | Freq: Every evening | ORAL | Status: DC | PRN

## 2016-05-08 NOTE — MAU Note (Addendum)
Started contracting last night.  Are not regular.  Having some mucous d/c with blood. Was 1 cm when last checked

## 2016-05-08 NOTE — H&P (Signed)
HPI: 26 y/o G2P1001 @ 5628w1d estimated gestational age (as dated by LMP c/w 20 week ultrasound) presents complaining of painful contractions since midnight.   no Leaking of Fluid,   no Vaginal Bleeding,   + Uterine Contractions,  + Fetal Movement.  ROS: no HA, no epigastric pain, no visual changes.    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   Prenatal Transfer Tool  Maternal Diabetes: Yes:  Diabetes Type:  Insulin/Medication controlled Genetic Screening: declined Maternal Ultrasounds/Referrals: Normal Fetal Ultrasounds or other Referrals:  None Maternal Substance Abuse:  No Significant Maternal Medications:  Meds include: Other: glyburide Significant Maternal Lab Results: Lab values include: Group B Strep negative   PNL:  GBS negative, Rub Immune, Hep B neg, RPR NR, HIV neg, GC/C neg, glucola:abnormal Hgb: 11.3 Blood type: A positive, antibody negative  OBHx: FTSNVD, 6#7oz, uncomplicated PMHx:  none Meds:  PNV, glyburide Allergy:  No Known Allergies SurgHx: none SocHx:   no Tobacco, no  EtOH, no Illicit Drugs  O: BP 127/82 (BP Location: Right Arm)   Pulse 78   Temp 98.5 F (36.9 C) (Oral)   Resp 18   Ht 5' (1.524 m)   Wt 166 lb (75.3 kg)   LMP 06/13/2015 (Exact Date)   BMI 32.42 kg/m  Gen. AAOx3, NAD CV.  RRR  No murmur.  Resp. CTAB, no wheeze or crackles. Abd. Gravid,  no tenderness,  no rigidity,  no guarding Extr.  no edema B/L , no calf tenderness  FHT: 130 baseline, moderate variability, + accels,  no decels Toco: q 4 min SVE: per RN 5/90/-2, soft   Labs: see orders  A/P:  26 y.o. G2P1001 @ 2728w1d EGA who presents for active labor -FWB:  NICHD Cat i FHTs -Labor: expectant management -GBS: negative -Pain management: IV or epidural upon request -GDMA2- plan for accuchecks q4hr  Myna HidalgoJennifer Marchello Rothgeb, DO [phone] (pager) [phone] (office)

## 2016-05-08 NOTE — Progress Notes (Addendum)
In to congratulate patient. No complaints. VSS Gen: Pt comfortable. Routine postpartum care. Pt recovering from cold.  Sudafed prn ordered. [facility] to cover over the weekend.  Pt aware.

## 2016-05-08 NOTE — Lactation Note (Signed)
This note was copied from a baby's chart. Lactation Consultation Note  Patient Name: Girl Dyanne IhaReyna Arguello ZOXWR'UToday's Date: 05/08/2016 Reason for consult: Initial assessment   [facility] [place] [facility]. Mom reports she attempted to BF her 26 yo and tried to BF for 3 months, he did not BF. She reports her nipples were much flatter with him and that she used breast shells,  Pump and a NS. Mom with compressible nipples and semi compressible areola. Her nipples are semi flat and erect with stim and infant suckling. Colostrum easily expressible, taught mom how to hand express and spoon feed.  Infant latched to right breast after multiple attempts. She did have some long suckling bursts and then would release nipple, she could get back on easily. After she fed on right breast for 15 minutes, showed mom how to hand express and spoon fed infant 2 cc colostrum. She then latched easily to left breast and was still feeding when I left the room.   Enc mom to feed infant STS 8-12 x in 24 hours at first feeding cues. Enc mom to offer infant breast every 2-3 hours. Mom is a GDM taking Glyburide. Discussed with mom using Breast shells and hand pump to help elongate nipple. Will provide once mom moves to PPU. Feeding log given with instructions for use.   BF Resources Handout and LC Brochure given, informed of IP/OP Services, BF Support Groups and LC phone #. Enc mom to call out to desk for feeding assistance as needed.   Maternal Data Formula Feeding for Exclusion: No Has patient been taught Hand Expression?: Yes Does the patient have breastfeeding experience prior to this delivery?: Yes  Feeding Feeding Type: Breast Fed Length of feed: 20 min (still feeding when I left the room)  LATCH Score/Interventions Latch: Repeated attempts needed to sustain latch, nipple held in mouth throughout feeding, stimulation needed to elicit sucking reflex. Intervention(s): Adjust  position;Assist with latch;Breast massage;Breast compression  Audible Swallowing: Spontaneous and intermittent  Type of Nipple: Flat (everts more with stim) Intervention(s): Shells;Hand pump  Comfort (Breast/Nipple): Soft / non-tender     Hold (Positioning): Assistance needed to correctly position infant at breast and maintain latch. Intervention(s): Breastfeeding basics reviewed;Support Pillows;Position options;Skin to skin  LATCH Score: 7  Lactation Tools Discussed/Used WIC Program: Yes   Consult Status Consult Status: Follow-up Date: 05/08/16 Follow-up type: In-patient    Silas FloodSharon S Cleophas Yoak 05/08/2016, 1:01 PM

## 2016-05-08 NOTE — MAU Note (Signed)
Urine in lab 

## 2016-05-09 LAB — CBC
HEMATOCRIT: 31.5 % — AB (ref 36.0–46.0)
HEMOGLOBIN: 10.3 g/dL — AB (ref 12.0–15.0)
MCH: 25.9 pg — AB (ref 26.0–34.0)
MCHC: 32.7 g/dL (ref 30.0–36.0)
MCV: 79.1 fL (ref 78.0–100.0)
Platelets: 216 10*3/uL (ref 150–400)
RBC: 3.98 MIL/uL (ref 3.87–5.11)
RDW: 15 % (ref 11.5–15.5)
WBC: 12 10*3/uL — ABNORMAL HIGH (ref 4.0–10.5)

## 2016-05-09 LAB — RPR: RPR Ser Ql: NONREACTIVE

## 2016-05-09 LAB — GLUCOSE, CAPILLARY: GLUCOSE-CAPILLARY: 79 mg/dL (ref 65–99)

## 2016-05-09 NOTE — Lactation Note (Signed)
This note was copied from a baby's chart. Lactation Consultation Note  Patient Name: Kathryn Cruz ZOXWR'UToday's Date: 05/09/2016 Reason for consult: Follow-up assessment    With this mom and term baby, now 5826 hours old. Mom denies needing lactation help at this time. When asked if she wanted a DEP set up, she said she was now able to latch the baby with each feeding, and then was supplemnting with formual. I asked that mom call for lactation with next feeding, so a latch could be observed, and more teaching done. Mom has visitors in the room, and the baby was asleep in a visitors arms.   Maternal Data    Feeding Feeding Type: Bottle Fed - Formula Nipple Type: Slow - flow  LATCH Score/Interventions                      Lactation Tools Discussed/Used     Consult Status Consult Status: Follow-up Date: 05/10/16 Follow-up type: In-patient    Alfred LevinsLee, Hanan Mcwilliams Anne 05/09/2016, 2:20 PM

## 2016-05-09 NOTE — Progress Notes (Signed)
Post Partum Day 1 Subjective:  Well. Lochia are normal. Voiding, ambulating, tolerating normal diet. feeding going well.  Objective: Blood pressure 113/65, pulse 64, temperature 97.9 F (36.6 C), temperature source Oral, resp. rate 16, height 5' (1.524 m), weight 166 lb (75.3 kg), last menstrual period 06/13/2015, unknown if currently breastfeeding.  Physical Exam:  General: normal Lochia: appropriate Uterine Fundus: @U  firm non-tender  Extremities: No evidence of DVT seen on physical exam. Edema none     Recent Labs  05/08/16 0921 05/09/16 0541  HGB 12.4 10.3*  HCT 37.8 31.5*    Assessment/Plan: Normal Post-partum. Continue routine post-partum care. Anticipate discharge tomorrow    LOS: 1 day   Elmore GuiseLori A Jihan Rudy CNM 05/09/2016, 11:56 AM

## 2016-05-10 ENCOUNTER — Inpatient Hospital Stay (HOSPITAL_COMMUNITY): Payer: 59

## 2016-05-10 LAB — GLUCOSE, CAPILLARY: GLUCOSE-CAPILLARY: 93 mg/dL (ref 65–99)

## 2016-05-10 MED ORDER — IBUPROFEN 600 MG PO TABS: 600.0000 mg | ORAL_TABLET | Freq: Four times a day (QID) | ORAL | 0 refills | Status: DC

## 2016-05-10 NOTE — Discharge Summary (Signed)
Obstetric Discharge Summary Reason for Admission: onset of labor Prenatal Procedures: NONE; gdm ON gLYBURIDE Intrapartum Procedures: spontaneous vaginal delivery and PRECIP Postpartum Procedures: none Complications-Operative and Postpartum: 1 degree perineal laceration  Delivery Note At 11:43 AM a viable female was delivered via Vaginal, Spontaneous Delivery (Presentation:vertex)  APGAR: 9, 9; weight pending.   Placenta status: L&D.  Pt felt pressure to push and pushed voluntarily.  Kathryn Cruz,CNM was present for her imminent delivery.  I arrived for delivery of placenta.  1st degree laceration repaired in the usual fashion using 3-0 vicryl.  Anesthesia:  local Episiotomy: None Lacerations:  1st degree Suture Repair: 3.0 vicryl Est. Blood Loss (mL):  250cc  Mom to postpartum.  Baby to Couplet care / Skin to Skin.  Kathryn Cruz, M 05/08/2016, 12:50 PM    Hospital Course:  Active Problems:   Normal labor   Kathryn Cruz is a 26 y.o. G2P2002 s/p svd.  Patient was admitted 05/08/16.  She has postpartum course that was uncomplicated. The pt feels ready to go home and  will be discharged with outpatient follow-up.   Today: No acute events overnight.  Pt denies problems with ambulating, voiding or po intake.  She denies nausea or vomiting.  Pain is well controlled.  She has had flatus. She has not had bowel movement.  Lochia Small.  Plan for birth control is UNDECIDED.  Method of Feeding: BREAST  Physical Exam:  BP 103/60 (BP Location: Right Arm)   Pulse 63   Temp 98.4 F (36.9 C) (Oral)   Resp 18   Ht 5' (1.524 m)   Wt 166 lb (75.3 kg)   LMP 06/13/2015 (Exact Date)   SpO2 100%   Breastfeeding? Unknown   BMI 32.42 kg/m  General: alert and cooperative Lochia: appropriate Uterine Fundus: firm Incision:  DVT Evaluation: No evidence of DVT seen on physical exam.  H/H: Lab Results  Component Value Date/Time   HGB 10.3 (L) 05/09/2016 05:41 AM   HCT 31.5 (L)  05/09/2016 05:41 AM    Discharge Diagnoses: Term Pregnancy-delivered  Discharge Information: Date: 05/10/2016 Activity: pelvic rest Diet: routine  Medications: Ibuprofen Breast feeding:  Yes Condition: stable Instructions: refer to practice specific booklet and refer to handout Discharge to: home   Discharge Instructions    Activity as tolerated    Complete by:  As directed    Call MD for:  difficulty breathing, headache or visual disturbances    Complete by:  As directed    Call MD for:  extreme fatigue    Complete by:  As directed    Call MD for:  hives    Complete by:  As directed    Call MD for:  persistant dizziness or light-headedness    Complete by:  As directed    Call MD for:  persistant nausea and vomiting    Complete by:  As directed    Call MD for:  severe uncontrolled pain    Complete by:  As directed    Call MD for:  temperature >100.4    Complete by:  As directed    Diet - low sodium heart healthy    Complete by:  As directed    Discharge instructions    Complete by:  As directed    rto 6 weeks   Lifting restrictions    Complete by:  As directed    Weight restriction of 10 lbs.   Sexual acrtivity    Complete by:  As directed  No sexual intercourse for 6 weeks     Allergies as of 05/10/2016   No Known Allergies     Medication List    STOP taking these medications   glyBURIDE 1.25 MG tablet Commonly known as:  DIABETA   pseudoephedrine 30 MG tablet Commonly known as:  SUDAFED     TAKE these medications   acetaminophen 500 MG tablet Commonly known as:  TYLENOL Take 1,000 mg by mouth every 6 (six) hours as needed for mild pain or moderate pain. For pain   GNP PRENATAL VITAMINS 28-0.8 MG Tabs Take 1 tablet by mouth daily.   ibuprofen 600 MG tablet Commonly known as:  ADVIL,MOTRIN Take 1 tablet (600 mg total) by mouth every 6 (six) hours.   ipratropium 0.06 % nasal spray Commonly known as:  ATROVENT Place 2 sprays into both nostrils 4  (four) times daily.      Follow-up Information    Geryl Rankins, MD. Schedule an appointment as soon as possible for a visit in 6 week(s).   Specialty:  Obstetrics and Gynecology Why:  Postpartum check, Diabetes test Contact information: [address] [place] [address] [phone]            Rhea Pink, CNM 05/10/2016,11:15 AM

## 2016-05-10 NOTE — Lactation Note (Signed)
This note was copied from a baby's chart. Lactation Consultation Note  Patient Name: Kathryn Cruz IONGE'XToday's Date: 05/10/2016 Reason for consult: Follow-up assessment    With this mom and term baby, now 446 hours old. Mom felt she did not have much milk. I reviewed with mom hand expression, supply and demand, and had her use the hand pump. I  was able to show her that her milk was transitioning in, and explained the more she breast feeds, and limits amounts of formula, the more her milk will come in. Mom wants to breast feed, but is doubtful of her ability. . I encouraged her to try breastfeeding, and only give formula if baby or mom get stressed, and then try and limit to 20 ml's, to keep baby going back to the breast. Baby was over dressed, sids precautions and appropriate amount of clothing reviewed with mom. Baby also had a pacifier in her mouth, and I also reviewed with her how and why to avoid the pacifier.  . I gave mom information on how to call for an o/p lactation consult, and she knows to call for questions/conerns.   Maternal Data    Feeding Feeding Type: Breast Fed Nipple Type: Slow - flow Length of feed:  (latached as I was leaving the room)  LATCH Score/Interventions Latch: Repeated attempts needed to sustain latch, nipple held in mouth throughout feeding, stimulation needed to elicit sucking reflex. Intervention(s): Adjust position;Assist with latch  Audible Swallowing: A few with stimulation  Type of Nipple: Everted at rest and after stimulation Intervention(s): Hand pump (mom able to express about 1-2 ml of transitional milk)  Comfort (Breast/Nipple): Soft / non-tender  Problem noted: Filling  Hold (Positioning): Assistance needed to correctly position infant at breast and maintain latch. Intervention(s): Breastfeeding basics reviewed;Support Pillows;Position options;Skin to skin  LATCH Score: 7  Lactation Tools Discussed/Used     Consult Status Consult  Status: Complete Follow-up type: Call as needed    Alfred LevinsLee, Nera Haworth Anne 05/10/2016, 9:56 AM

## 2016-05-10 NOTE — Discharge Instructions (Signed)
Postpartum Depression and Baby Blues °The postpartum period begins right after the birth of a baby. During this time, there is often a great amount of joy and excitement. It is also a time of many changes in the life of the parents. Regardless of how many times a mother gives birth, each child brings new challenges and dynamics to the family. It is not unusual to have feelings of excitement along with confusing shifts in moods, emotions, and thoughts. All mothers are at risk of developing postpartum depression or the "baby blues." These mood changes can occur right after giving birth, or they may occur many months after giving birth. The baby blues or postpartum depression can be mild or severe. Additionally, postpartum depression can go away rather quickly, or it can be a long-term condition. °What are the causes? °Raised hormone levels and the rapid drop in those levels are thought to be a main cause of postpartum depression and the baby blues. A number of hormones change during and after pregnancy. Estrogen and progesterone usually decrease right after the delivery of your baby. The levels of thyroid hormone and various cortisol steroids also rapidly drop. Other factors that play a role in these mood changes include major life events and genetics. °What increases the risk? °If you have any of the following risks for the baby blues or postpartum depression, know what symptoms to watch out for during the postpartum period. Risk factors that may increase the likelihood of getting the baby blues or postpartum depression include: °· Having a personal or family history of depression. °· Having depression while being pregnant. °· Having premenstrual mood issues or mood issues related to oral contraceptives. °· Having a lot of life stress. °· Having marital conflict. °· Lacking a social support network. °· Having a baby with special needs. °· Having health problems, such as diabetes. °What are the signs or  symptoms? °Symptoms of baby blues include: °· Brief changes in mood, such as going from extreme happiness to sadness. °· Decreased concentration. °· Difficulty sleeping. °· Crying spells, tearfulness. °· Irritability. °· Anxiety. °Symptoms of postpartum depression typically begin within the first month after giving birth. These symptoms include: °· Difficulty sleeping or excessive sleepiness. °· Marked weight loss. °· Agitation. °· Feelings of worthlessness. °· Lack of interest in activity or food. °Postpartum psychosis is a very serious condition and can be dangerous. Fortunately, it is rare. Displaying any of the following symptoms is cause for immediate medical attention. Symptoms of postpartum psychosis include: °· Hallucinations and delusions. °· Bizarre or disorganized behavior. °· Confusion or disorientation. °How is this diagnosed? °A diagnosis is made by an evaluation of your symptoms. There are no medical or lab tests that lead to a diagnosis, but there are various questionnaires that a health care provider may use to identify those with the baby blues, postpartum depression, or psychosis. Often, a screening tool called the Edinburgh Postnatal Depression Scale is used to diagnose depression in the postpartum period. °How is this treated? °The baby blues usually goes away on its own in 1-2 weeks. Social support is often all that is needed. You will be encouraged to get adequate sleep and rest. Occasionally, you may be given medicines to help you sleep. °Postpartum depression requires treatment because it can last several months or longer if it is not treated. Treatment may include individual or group therapy, medicine, or both to address any social, physiological, and psychological factors that may play a role in the depression. Regular exercise, a   healthy diet, rest, and social support may also be strongly recommended. °Postpartum psychosis is more serious and needs treatment right away. Hospitalization is  often needed. °Follow these instructions at home: °· Get as much rest as you can. Nap when the baby sleeps. °· Exercise regularly. Some women find yoga and walking to be beneficial. °· Eat a balanced and nourishing diet. °· Do little things that you enjoy. Have a cup of tea, take a bubble bath, read your favorite magazine, or listen to your favorite music. °· Avoid alcohol. °· Ask for help with household chores, cooking, grocery shopping, or running errands as needed. Do not try to do everything. °· Talk to people close to you about how you are feeling. Get support from your partner, family members, friends, or other new moms. °· Try to stay positive in how you think. Think about the things you are grateful for. °· Do not spend a lot of time alone. °· Only take over-the-counter or prescription medicine as directed by your health care provider. °· Keep all your postpartum appointments. °· Let your health care provider know if you have any concerns. °Contact a health care provider if: °You are having a reaction to or problems with your medicine. °Get help right away if: °· You have suicidal feelings. °· You think you may harm the baby or someone else. °This information is not intended to replace advice given to you by your health care provider. Make sure you discuss any questions you have with your health care provider. °Document Released: 01/09/2004 Document Revised: 09/12/2015 Document Reviewed: 01/16/2013 °Elsevier Interactive Patient Education © 2017 [employer] °Postpartum Care After Vaginal Delivery °The period of time right after you deliver your newborn is called the postpartum period. °What kind of medical care will I receive? °· You may continue to receive fluids and medicines through an IV tube inserted into one of your veins. °· If an incision was made near your vagina (episiotomy) or if you had some vaginal tearing during delivery, cold compresses may be placed on your episiotomy or your tear. This  helps to reduce pain and swelling. °· You may be given a squirt bottle to use when you go to the bathroom. You may use this until you are comfortable wiping as usual. To use the squirt bottle, follow these steps: °¨ Before you urinate, fill the squirt bottle with warm water. Do not use hot water. °¨ After you urinate, while you are sitting on the toilet, use the squirt bottle to rinse the area around your urethra and vaginal opening. This rinses away any urine and blood. °¨ You may do this instead of wiping. As you start healing, you may use the squirt bottle before wiping yourself. Make sure to wipe gently. °¨ Fill the squirt bottle with clean water every time you use the bathroom. °· You will be given sanitary pads to wear. °How can I expect to feel? °· You may not feel the need to urinate for several hours after delivery. °· You will have some soreness and pain in your abdomen and vagina. °· If you are breastfeeding, you may have uterine contractions every time you breastfeed for up to several weeks postpartum. Uterine contractions help your uterus return to its normal size. °· It is normal to have vaginal bleeding (lochia) after delivery. The amount and appearance of lochia is often similar to a menstrual period in the first week after delivery. It will gradually decrease over the next few weeks to a   dry, yellow-brown discharge. For most women, lochia stops completely by 6-8 weeks after delivery. Vaginal bleeding can vary from woman to woman. °· Within the first few days after delivery, you may have breast engorgement. This is when your breasts feel heavy, full, and uncomfortable. Your breasts may also throb and feel hard, tightly stretched, warm, and tender. After this occurs, you may have milk leaking from your breasts. Your health care provider can help you relieve discomfort due to breast engorgement. Breast engorgement should go away within a few days. °· You may feel more sad or worried than normal due to  hormonal changes after delivery. These feelings should not last more than a few days. If these feelings do not go away after several days, speak with your health care provider. °How should I care for myself? °· Tell your health care provider if you have pain or discomfort. °· Drink enough water to keep your urine clear or pale yellow. °· Wash your hands thoroughly with soap and water for at least 20 seconds after changing your sanitary pads, after using the toilet, and before holding or feeding your baby. °· If you are not breastfeeding, avoid touching your breasts a lot. Doing this can make your breasts produce more milk. °· If you become weak or lightheaded, or you feel like you might faint, ask for help before: °¨ Getting out of bed. °¨ Showering. °· Change your sanitary pads frequently. Watch for any changes in your flow, such as a sudden increase in volume, a change in color, the passing of large blood clots. If you pass a blood clot from your vagina, save it to show to your health care provider. Do not flush blood clots down the toilet without having your health care provider look at them. °· Make sure that all your vaccinations are up to date. This can help protect you and your baby from getting certain diseases. You may need to have immunizations done before you leave the hospital. °· If desired, talk with your health care provider about methods of family planning or birth control (contraception). °How can I start bonding with my baby? °Spending as much time as possible with your baby is very important. During this time, you and your baby can get to know each other and develop a bond. Having your baby stay with you in your room (rooming in) can give you time to get to know your baby. Rooming in can also help you become comfortable caring for your baby. Breastfeeding can also help you bond with your baby. °How can I plan for returning home with my baby? °· Make sure that you have a car seat installed in your  vehicle. °¨ Your car seat should be checked by a certified car seat installer to make sure that it is installed safely. °¨ Make sure that your baby fits into the car seat safely. °· Ask your health care provider any questions you have about caring for yourself or your baby. Make sure that you are able to contact your health care provider with any questions after leaving the hospital. °This information is not intended to replace advice given to you by your health care provider. Make sure you discuss any questions you have with your health care provider. °Document Released: 02/01/2007 Document Revised: 09/09/2015 Document Reviewed: 03/11/2015 °Elsevier Interactive Patient Education © 2017 [employer] °Home Care Instructions for Mom °Introduction ° ACTIVITY °· Gradually return to your regular activities. °· Let yourself rest. Nap while your baby sleeps. °·   Avoid lifting anything that is heavier than 10 lb (4.5 kg) until your health care provider says it is okay. °· Avoid activities that take a lot of effort and energy (are strenuous) until approved by your health care provider. Walking at a slow-to-moderate pace is usually safe. °· If you had a cesarean delivery: °¨ Do not vacuum, climb stairs, or drive a car for 4-6 weeks. °¨ Have someone help you at home until you feel like you can do your usual activities yourself. °¨ Do exercises as told by your health care provider, if this applies. °VAGINAL BLEEDING °You may continue to bleed for 4-6 weeks after delivery. Over time, the amount of blood usually decreases and the color of the blood usually gets lighter. However, the flow of bright red blood may increase if you have been too active. If you need to use more than one pad in an hour because your pad gets soaked, or if you pass a large clot: °· Lie down. °· Raise your feet. °· Place a cold compress on your lower abdomen. °· Rest. °· Call your health care provider. °If you are breastfeeding, your period should return  anytime between 8 weeks after delivery and the time that you stop breastfeeding. If you are not breastfeeding, your period should return 6-8 weeks after delivery. °PERINEAL CARE °The perineal area, or perineum, is the part of your body between your thighs. After delivery, this area needs special care. Follow these instructions as told by your health care provider. °· Take warm tub baths for 15-20 minutes. °· Use medicated pads and pain-relieving sprays and creams as told. °· Do not use tampons or douches until vaginal bleeding has stopped. °· Each time you go to the bathroom: °¨ Use a peri bottle. °¨ Change your pad. °¨ Use towelettes in place of toilet paper until your stitches have healed. °· Do Kegel exercises every day. Kegel exercises help to maintain the muscles that support the vagina, bladder, and bowels. You can do these exercises while you are standing, sitting, or lying down. To do Kegel exercises: °¨ Tighten the muscles of your abdomen and the muscles that surround your birth canal. °¨ Hold for a few seconds. °¨ Relax. °¨ Repeat until you have done this 5 times in a row. °· To prevent hemorrhoids from developing or getting worse: °¨ Drink enough fluid to keep your urine clear or pale yellow. °¨ Avoid straining when having a bowel movement. °¨ Take over-the-counter medicines and stool softeners as told by your health care provider. °BREAST CARE °· Wear a tight-fitting bra. °· Avoid taking over-the-counter pain medicine for breast discomfort. °· Apply ice to the breasts to help with discomfort as needed: °¨ Put ice in a plastic bag. °¨ Place a towel between your skin and the bag. °¨ Leave the ice on for 20 minutes or as told by your health care provider. °NUTRITION °· Eat a well-balanced diet. °· Do not try to lose weight quickly by cutting back on calories. °· Take your prenatal vitamins until your postpartum checkup or until your health care provider tells you to stop. °POSTPARTUM DEPRESSION °You may  find yourself crying for no apparent reason and unable to cope with all of the changes that come with having a newborn. This mood is called postpartum depression. Postpartum depression happens because your hormone levels change after delivery. If you have postpartum depression, get support from your partner, friends, and family. If the depression does not go away on its own   after several weeks, contact your health care provider. °BREAST SELF-EXAM °Do a breast self-exam each month, at the same time of the month. If you are breastfeeding, check your breasts just after a feeding, when your breasts are less full. If you are breastfeeding and your period has started, check your breasts on day 5, 6, or 7 of your period. °Report any lumps, bumps, or discharge to your health care provider. Know that breasts are normally lumpy if you are breastfeeding. This is temporary, and it is not a health risk. °INTIMACY AND SEXUALITY °Avoid sexual activity for at least 3-4 weeks after delivery or until the brownish-red vaginal flow is completely gone. If you want to avoid pregnancy, use some form of birth control. You can get pregnant after delivery, even if you have not had your period. °SEEK MEDICAL CARE IF: °· You feel unable to cope with the changes that a child brings to your life, and these feelings do not go away after several weeks. °· You notice a lump, a bump, or discharge on your breast. °SEEK IMMEDIATE MEDICAL CARE IF: °· Blood soaks your pad in 1 hour or less. °· You have: °¨ Severe pain or cramping in your lower abdomen. °¨ A bad-smelling vaginal discharge. °¨ A fever that is not controlled by medicine. °¨ A fever, and an area of your breast is red and sore. °¨ Pain or redness in your calf. °¨ Sudden, severe chest pain. °¨ Shortness of breath. °¨ Painful or bloody urination. °¨ Problems with your vision. °· You vomit for 12 hours or longer. °· You develop a severe headache. °· You have serious thoughts about hurting  yourself, your child, or anyone else. °This information is not intended to replace advice given to you by your health care provider. Make sure you discuss any questions you have with your health care provider. °Document Released: 04/03/2000 Document Revised: 09/12/2015 Document Reviewed: 10/08/2014 °© 2017 Elsevier ° °

## 2018-10-24 ENCOUNTER — Other Ambulatory Visit: Payer: Self-pay | Admitting: *Deleted

## 2018-10-24 DIAGNOSIS — Z20822 Contact with and (suspected) exposure to covid-19: Secondary | ICD-10-CM

## 2018-11-07 NOTE — Addendum Note (Signed)
Addended by: Brigitte Pulse on: 11/07/2018 05:13 PM   Modules accepted: Orders

## 2018-11-11 DIAGNOSIS — R7309 Other abnormal glucose: Secondary | ICD-10-CM | POA: Diagnosis not present

## 2018-11-11 DIAGNOSIS — Z1329 Encounter for screening for other suspected endocrine disorder: Secondary | ICD-10-CM | POA: Diagnosis not present

## 2018-11-11 DIAGNOSIS — Z0100 Encounter for examination of eyes and vision without abnormal findings: Secondary | ICD-10-CM | POA: Diagnosis not present

## 2018-11-11 DIAGNOSIS — Z131 Encounter for screening for diabetes mellitus: Secondary | ICD-10-CM | POA: Diagnosis not present

## 2018-11-11 DIAGNOSIS — E663 Overweight: Secondary | ICD-10-CM | POA: Diagnosis not present

## 2018-11-11 DIAGNOSIS — R61 Generalized hyperhidrosis: Secondary | ICD-10-CM | POA: Diagnosis not present

## 2018-11-16 ENCOUNTER — Other Ambulatory Visit (HOSPITAL_COMMUNITY)
Admission: RE | Admit: 2018-11-16 | Discharge: 2018-11-16 | Disposition: A | Discharge status: Home / Self Care | Payer: Medicaid Other | Source: Ambulatory Visit | Attending: Obstetrics and Gynecology | Admitting: Obstetrics and Gynecology

## 2018-11-16 ENCOUNTER — Other Ambulatory Visit: Payer: Self-pay | Admitting: Obstetrics and Gynecology

## 2018-11-16 DIAGNOSIS — E282 Polycystic ovarian syndrome: Secondary | ICD-10-CM | POA: Diagnosis not present

## 2018-11-16 DIAGNOSIS — Z124 Encounter for screening for malignant neoplasm of cervix: Secondary | ICD-10-CM | POA: Insufficient documentation

## 2018-11-16 DIAGNOSIS — N852 Hypertrophy of uterus: Secondary | ICD-10-CM | POA: Diagnosis not present

## 2018-11-16 DIAGNOSIS — B977 Papillomavirus as the cause of diseases classified elsewhere: Secondary | ICD-10-CM | POA: Diagnosis not present

## 2018-11-16 DIAGNOSIS — N939 Abnormal uterine and vaginal bleeding, unspecified: Secondary | ICD-10-CM | POA: Diagnosis not present

## 2018-11-18 LAB — CYTOLOGY - PAP
Diagnosis: NEGATIVE
HPV: NOT DETECTED

## 2018-11-25 DIAGNOSIS — E663 Overweight: Secondary | ICD-10-CM | POA: Diagnosis not present

## 2018-11-25 DIAGNOSIS — R7303 Prediabetes: Secondary | ICD-10-CM | POA: Diagnosis not present

## 2018-11-25 DIAGNOSIS — R635 Abnormal weight gain: Secondary | ICD-10-CM | POA: Diagnosis not present

## 2018-11-25 DIAGNOSIS — R61 Generalized hyperhidrosis: Secondary | ICD-10-CM | POA: Diagnosis not present

## 2018-11-29 DIAGNOSIS — N852 Hypertrophy of uterus: Secondary | ICD-10-CM | POA: Diagnosis not present

## 2018-11-29 DIAGNOSIS — N939 Abnormal uterine and vaginal bleeding, unspecified: Secondary | ICD-10-CM | POA: Diagnosis not present

## 2018-11-29 DIAGNOSIS — E282 Polycystic ovarian syndrome: Secondary | ICD-10-CM | POA: Diagnosis not present

## 2018-11-30 ENCOUNTER — Other Ambulatory Visit: Payer: Self-pay | Admitting: *Deleted

## 2018-11-30 DIAGNOSIS — Z20822 Contact with and (suspected) exposure to covid-19: Secondary | ICD-10-CM

## 2018-11-30 DIAGNOSIS — R6889 Other general symptoms and signs: Secondary | ICD-10-CM | POA: Diagnosis not present

## 2018-12-01 LAB — NOVEL CORONAVIRUS, NAA: SARS-CoV-2, NAA: NOT DETECTED

## 2018-12-23 DIAGNOSIS — E282 Polycystic ovarian syndrome: Secondary | ICD-10-CM | POA: Diagnosis not present

## 2018-12-23 DIAGNOSIS — F419 Anxiety disorder, unspecified: Secondary | ICD-10-CM | POA: Diagnosis not present

## 2018-12-23 DIAGNOSIS — R61 Generalized hyperhidrosis: Secondary | ICD-10-CM | POA: Diagnosis not present

## 2018-12-23 DIAGNOSIS — N3001 Acute cystitis with hematuria: Secondary | ICD-10-CM | POA: Diagnosis not present

## 2018-12-23 DIAGNOSIS — K219 Gastro-esophageal reflux disease without esophagitis: Secondary | ICD-10-CM | POA: Diagnosis not present

## 2018-12-23 DIAGNOSIS — E782 Mixed hyperlipidemia: Secondary | ICD-10-CM | POA: Diagnosis not present

## 2018-12-23 DIAGNOSIS — R7303 Prediabetes: Secondary | ICD-10-CM | POA: Diagnosis not present

## 2018-12-23 DIAGNOSIS — E663 Overweight: Secondary | ICD-10-CM | POA: Diagnosis not present

## 2018-12-23 DIAGNOSIS — R635 Abnormal weight gain: Secondary | ICD-10-CM | POA: Diagnosis not present

## 2019-01-20 DIAGNOSIS — N3001 Acute cystitis with hematuria: Secondary | ICD-10-CM | POA: Diagnosis not present

## 2019-01-20 DIAGNOSIS — E663 Overweight: Secondary | ICD-10-CM | POA: Diagnosis not present

## 2019-01-20 DIAGNOSIS — R635 Abnormal weight gain: Secondary | ICD-10-CM | POA: Diagnosis not present

## 2019-01-20 DIAGNOSIS — E282 Polycystic ovarian syndrome: Secondary | ICD-10-CM | POA: Diagnosis not present

## 2019-01-20 DIAGNOSIS — F419 Anxiety disorder, unspecified: Secondary | ICD-10-CM | POA: Diagnosis not present

## 2019-01-20 DIAGNOSIS — R7303 Prediabetes: Secondary | ICD-10-CM | POA: Diagnosis not present

## 2019-01-20 DIAGNOSIS — R61 Generalized hyperhidrosis: Secondary | ICD-10-CM | POA: Diagnosis not present

## 2019-01-20 DIAGNOSIS — K219 Gastro-esophageal reflux disease without esophagitis: Secondary | ICD-10-CM | POA: Diagnosis not present

## 2019-01-20 DIAGNOSIS — E782 Mixed hyperlipidemia: Secondary | ICD-10-CM | POA: Diagnosis not present

## 2019-01-20 DIAGNOSIS — Z23 Encounter for immunization: Secondary | ICD-10-CM | POA: Diagnosis not present

## 2019-01-26 DIAGNOSIS — Z0000 Encounter for general adult medical examination without abnormal findings: Secondary | ICD-10-CM | POA: Diagnosis not present

## 2019-01-26 DIAGNOSIS — E282 Polycystic ovarian syndrome: Secondary | ICD-10-CM | POA: Diagnosis not present

## 2019-02-17 ENCOUNTER — Other Ambulatory Visit: Payer: Self-pay

## 2019-02-17 DIAGNOSIS — Z20822 Contact with and (suspected) exposure to covid-19: Secondary | ICD-10-CM

## 2019-02-19 LAB — NOVEL CORONAVIRUS, NAA: SARS-CoV-2, NAA: NOT DETECTED

## 2019-02-22 ENCOUNTER — Other Ambulatory Visit: Payer: Self-pay

## 2019-02-22 ENCOUNTER — Emergency Department (HOSPITAL_COMMUNITY): Payer: Medicaid Other

## 2019-02-22 ENCOUNTER — Encounter (HOSPITAL_COMMUNITY): Payer: Self-pay | Admitting: Emergency Medicine

## 2019-02-22 ENCOUNTER — Emergency Department (HOSPITAL_COMMUNITY)
Admission: EM | Admit: 2019-02-22 | Discharge: 2019-02-23 | Discharge status: Against Medical Advice | Payer: Medicaid Other | Attending: Emergency Medicine | Admitting: Emergency Medicine

## 2019-02-22 DIAGNOSIS — Z5321 Procedure and treatment not carried out due to patient leaving prior to being seen by health care provider: Secondary | ICD-10-CM | POA: Diagnosis not present

## 2019-02-22 DIAGNOSIS — R0789 Other chest pain: Secondary | ICD-10-CM | POA: Diagnosis present

## 2019-02-22 DIAGNOSIS — R7989 Other specified abnormal findings of blood chemistry: Secondary | ICD-10-CM | POA: Diagnosis not present

## 2019-02-22 DIAGNOSIS — R079 Chest pain, unspecified: Secondary | ICD-10-CM | POA: Diagnosis not present

## 2019-02-22 DIAGNOSIS — I2699 Other pulmonary embolism without acute cor pulmonale: Secondary | ICD-10-CM | POA: Diagnosis not present

## 2019-02-22 LAB — CBC
HCT: 40.4 % (ref 36.0–46.0)
Hemoglobin: 12.8 g/dL (ref 12.0–15.0)
MCH: 25.7 pg — ABNORMAL LOW (ref 26.0–34.0)
MCHC: 31.7 g/dL (ref 30.0–36.0)
MCV: 81.1 fL (ref 80.0–100.0)
Platelets: 307 10*3/uL (ref 150–400)
RBC: 4.98 MIL/uL (ref 3.87–5.11)
RDW: 13.3 % (ref 11.5–15.5)
WBC: 10.8 10*3/uL — ABNORMAL HIGH (ref 4.0–10.5)
nRBC: 0 % (ref 0.0–0.2)

## 2019-02-22 LAB — BASIC METABOLIC PANEL
Anion gap: 9 (ref 5–15)
BUN: 6 mg/dL (ref 6–20)
CO2: 25 mmol/L (ref 22–32)
Calcium: 9.3 mg/dL (ref 8.9–10.3)
Chloride: 103 mmol/L (ref 98–111)
Creatinine, Ser: 0.79 mg/dL (ref 0.44–1.00)
GFR calc Af Amer: >60 mL/min (ref >60)
GFR calc non Af Amer: >60 mL/min (ref >60)
Glucose, Bld: 190 mg/dL — ABNORMAL HIGH (ref 70–99)
Potassium: 3.7 mmol/L (ref 3.5–5.1)
Sodium: 137 mmol/L (ref 135–145)

## 2019-02-22 LAB — PROTIME-INR
INR: 1 (ref 0.8–1.2)
Prothrombin Time: 12.9 seconds (ref 11.4–15.2)

## 2019-02-22 LAB — I-STAT BETA HCG BLOOD, ED (MC, WL, AP ONLY): I-stat hCG, quantitative: <5 m[IU]/mL (ref <5)

## 2019-02-22 LAB — TROPONIN I (HIGH SENSITIVITY): Troponin I (High Sensitivity): 4 ng/L (ref <18)

## 2019-02-22 MED ORDER — SODIUM CHLORIDE 0.9% FLUSH: 3.0000 mL | Freq: Once | INTRAVENOUS | Status: DC

## 2019-02-22 NOTE — ED Triage Notes (Signed)
Patient reports intermittent left upper chest pain radiating to left upper back for 7 days , mild SOB and dry cough , denies emesis or diaphoresis . No fever or chills .

## 2019-02-23 ENCOUNTER — Other Ambulatory Visit: Payer: Self-pay

## 2019-02-23 ENCOUNTER — Emergency Department (HOSPITAL_COMMUNITY)
Admission: EM | Admit: 2019-02-23 | Discharge: 2019-02-23 | Disposition: A | Discharge status: Home / Self Care | Payer: Medicaid Other | Source: Home / Self Care | Attending: Emergency Medicine | Admitting: Emergency Medicine

## 2019-02-23 ENCOUNTER — Emergency Department (HOSPITAL_COMMUNITY): Payer: Medicaid Other

## 2019-02-23 ENCOUNTER — Encounter (HOSPITAL_COMMUNITY): Payer: Self-pay | Admitting: *Deleted

## 2019-02-23 DIAGNOSIS — I2699 Other pulmonary embolism without acute cor pulmonale: Secondary | ICD-10-CM | POA: Diagnosis not present

## 2019-02-23 DIAGNOSIS — R079 Chest pain, unspecified: Secondary | ICD-10-CM | POA: Diagnosis not present

## 2019-02-23 DIAGNOSIS — R05 Cough: Secondary | ICD-10-CM | POA: Insufficient documentation

## 2019-02-23 DIAGNOSIS — R7989 Other specified abnormal findings of blood chemistry: Secondary | ICD-10-CM | POA: Diagnosis not present

## 2019-02-23 DIAGNOSIS — M791 Myalgia, unspecified site: Secondary | ICD-10-CM | POA: Insufficient documentation

## 2019-02-23 DIAGNOSIS — R0602 Shortness of breath: Secondary | ICD-10-CM | POA: Insufficient documentation

## 2019-02-23 LAB — URINALYSIS, ROUTINE W REFLEX MICROSCOPIC
Bilirubin Urine: NEGATIVE
Glucose, UA: NEGATIVE mg/dL
Hgb urine dipstick: NEGATIVE
Ketones, ur: NEGATIVE mg/dL
Nitrite: NEGATIVE
Protein, ur: NEGATIVE mg/dL
Specific Gravity, Urine: >1.046 — ABNORMAL HIGH (ref 1.005–1.030)
pH: 7 (ref 5.0–8.0)

## 2019-02-23 LAB — TROPONIN I (HIGH SENSITIVITY): Troponin I (High Sensitivity): 4 ng/L (ref <18)

## 2019-02-23 LAB — D-DIMER, QUANTITATIVE: D-Dimer, Quant: 1.07 ug/mL-FEU — ABNORMAL HIGH (ref 0.00–0.50)

## 2019-02-23 LAB — CK: Total CK: 49 U/L (ref 38–234)

## 2019-02-23 MED ORDER — FENTANYL CITRATE (PF) 100 MCG/2ML IJ SOLN
50.0000 ug | Freq: Once | INTRAMUSCULAR | Status: AC
  Administered 2019-02-23: 50 ug via INTRAVENOUS
  Filled 2019-02-23: qty 2

## 2019-02-23 MED ORDER — SODIUM CHLORIDE 0.9 % IV BOLUS
1000.0000 mL | Freq: Once | INTRAVENOUS | Status: AC
  Administered 2019-02-23: 1000 mL via INTRAVENOUS

## 2019-02-23 MED ORDER — SODIUM CHLORIDE (PF) 0.9 % IJ SOLN
INTRAMUSCULAR | Status: AC
  Filled 2019-02-23: qty 50

## 2019-02-23 MED ORDER — IOHEXOL 350 MG/ML SOLN
100.0000 mL | Freq: Once | INTRAVENOUS | Status: AC | PRN
  Administered 2019-02-23: 100 mL via INTRAVENOUS

## 2019-02-23 NOTE — ED Provider Notes (Signed)
7:25 AM patient is here with complaint of chest pain.  She was seen and had labs done at [facility] earlier tonight and then left and came to [place] [facility].  D-dimer was added here and was elevated.  CT angiography completed and was negative for PE.  I told the patient about her results.  Upon further questioning she reports extreme pain in her arms and legs in the muscles.  She states that she is not noted any urine changes but she does not really look at her urine.  She states that she feels generally weak all over.  I added a CK given the severity of the patient's reported pain as well as a UA.  Will give additional fluid bolus.  Anticipate discharged home if these labs look normal.  In addition, EKG reviewed by myself without any ischemic findings.  8:55 AM reviewed work-up with patient at bedside.  CK and UA are reassuring.  Patient was given a liter bolus.  Encouraged PCP follow-up if not improving in the next 72 hours or return to the emergency department with worsening symptoms.  She verbalizes understanding and agrees with plan.  BP (!) 105/47   Pulse 96   Temp 99.4 F (37.4 C) (Oral)   Resp 17   SpO2 100%     Carlisle Cater, PA-C 02/23/19 [id number]    Virgel Manifold, MD 02/23/19 [phone]

## 2019-02-23 NOTE — Discharge Instructions (Signed)
Please read and follow all provided instructions.  Your diagnoses today include:  1. Myalgia     Tests performed today include:  CT scan of your chest -does not show any blood clots or pneumonia  Blood cell counts and electrolytes - are normal  Blood test for heart irritation -normal  Blood test for muscle breakdown -was normal  Urine test -shows signs of dehydration  Vital signs. See below for your results today.   Medications prescribed:  Please use over-the-counter NSAID medications (ibuprofen, naproxen) as directed on the packaging for pain.   Take any prescribed medications only as directed.  Home care instructions:  Follow any educational materials contained in this packet.  BE VERY CAREFUL not to take multiple medicines containing Tylenol (also called acetaminophen). Doing so can lead to an overdose which can damage your liver and cause liver failure and possibly death.   Follow-up instructions: Please follow-up with your primary care provider in the next 3 days for further evaluation of your symptoms if not improved.   Return instructions:   Please return to the Emergency Department if you experience worsening symptoms.   Return with worsening trouble breathing, increased work of breathing, high fever, persistent vomiting.  Please return if you have any other emergent concerns.  Additional Information:  Your vital signs today were: BP (!) 105/47    Pulse 96    Temp 99.4 F (37.4 C) (Oral)    Resp 17    SpO2 100%  If your blood pressure (BP) was elevated above 135/85 this visit, please have this repeated by your doctor within one month. --------------

## 2019-02-23 NOTE — ED Notes (Signed)
Obtained a second IV for appropriate gauge size for a CT angio.

## 2019-02-23 NOTE — ED Provider Notes (Signed)
[facility]-EMERGENCY DEPT Provider Note   CSN: [id number] Arrival date & time: 02/23/19  0255     History   Chief Complaint Chief Complaint  Patient presents with  . Chest Pain    HPI Kathryn Cruz is a 28 y.o. female.     Patient presents to the ED with a chief complaint of chest pain x 1 week.  She reports associated SOB, back pain, and pain in her extremities. She denies any fever, but has had some cough.  She recently tested negative for COVID.  She describes her symptoms and sharp and stabbing.  Nothing makes the symptoms any better or worse. She denies any other medical problems.  She doesn't take any medications regularly.  She just stopped taking OCPs about a month ago.  The history is provided by the patient. No language interpreter was used.    Past Medical History:  Diagnosis Date  . Gestational diabetes    diet controlled, glyburide  . Headache   . History of chlamydia   . Infection    uti    Patient Active Problem List   Diagnosis Date Noted  . Normal labor 05/08/2016    Past Surgical History:  Procedure Laterality Date  . NO PAST SURGERIES    . WISDOM TOOTH EXTRACTION       OB History    Gravida  2   Para  2   Term  2   Preterm      AB      Living  2     SAB      TAB      Ectopic      Multiple  0   Live Births  2            Home Medications    Prior to Admission medications   Medication Sig Start Date End Date Taking? Authorizing Provider  acetaminophen (TYLENOL) 500 MG tablet Take 1,000 mg by mouth every 6 (six) hours as needed for mild pain or moderate pain. For pain     [provider]  ibuprofen (ADVIL,MOTRIN) 600 MG tablet Take 1 tablet (600 mg total) by mouth every 6 (six) hours. 05/10/16   Clemmons, Lori A, CNM  ipratropium (ATROVENT) 0.06 % nasal spray Place 2 sprays into both nostrils 4 (four) times daily.    [provider]  Prenatal Vit-Fe Fumarate-FA (GNP PRENATAL  VITAMINS) 28-0.8 MG TABS Take 1 tablet by mouth daily. 09/10/15   Molpus, John, MD    Family History Family History  Problem Relation Age of Onset  . Hypertension Mother   . Diabetes Mother   . Hyperlipidemia Mother   . Hypertension Maternal Grandmother   . Cancer Paternal Grandmother        throat    Social History Social History   Tobacco Use  . Smoking status: Never Smoker  . Smokeless tobacco: Never Used  Substance Use Topics  . Alcohol use: No  . Drug use: No     Allergies   Patient has no known allergies.   Review of Systems Review of Systems  All other systems reviewed and are negative.    Physical Exam Updated Vital Signs BP (!) 151/62 (BP Location: Right Arm)   Pulse (!) 112   Temp 98.5 F (36.9 C) (Oral)   Resp 18   SpO2 94%   Physical Exam Vitals signs and nursing note reviewed.  Constitutional:      General: She is not in  acute distress.    Appearance: She is well-developed.  HENT:     Head: Normocephalic and atraumatic.  Eyes:     Conjunctiva/sclera: Conjunctivae normal.  Neck:     Musculoskeletal: Neck supple.  Cardiovascular:     Rate and Rhythm: Normal rate and regular rhythm.     Heart sounds: No murmur.  Pulmonary:     Effort: Pulmonary effort is normal. No respiratory distress.     Breath sounds: Normal breath sounds.  Abdominal:     Palpations: Abdomen is soft.     Tenderness: There is no abdominal tenderness.  Musculoskeletal: Normal range of motion.  Skin:    General: Skin is warm and dry.  Neurological:     Mental Status: She is alert and oriented to person, place, and time.  Psychiatric:        Mood and Affect: Mood normal.        Behavior: Behavior normal.      ED Treatments / Results  Labs (all labs ordered are listed, but only abnormal results are displayed) Labs Reviewed  D-DIMER, QUANTITATIVE (NOT AT [facility])    EKG None  Radiology Dg Chest 2 View  Result Date: 02/22/2019 CLINICAL DATA:  Chest pain  EXAM: CHEST - 2 VIEW COMPARISON:  April 18, 1999 FINDINGS: The heart size and mediastinal contours are within normal limits. Both lungs are clear. The visualized skeletal structures are unremarkable. IMPRESSION: No active cardiopulmonary disease. Electronically Signed   By: Constance Holster M.D.   On: 02/22/2019 22:17    Procedures Procedures (including critical care time)  Medications Ordered in ED Medications  fentaNYL (SUBLIMAZE) injection 50 mcg (has no administration in time range)     Initial Impression / Assessment and Plan / ED Course  I have reviewed the triage vital signs and the nursing notes.  Pertinent labs & imaging results that were available during my care of the patient were reviewed by me and considered in my medical decision making (see chart for details).        Patient with CP x 1 week.  Labs done at [facility] prior to patient leaving there and coming to [facility].  Labs notable for nonspecific leukocytosis.  CXR negative.  EKG notable for tachycardia, but no ischemic changes or arrhythmias seen.  Will check d-dimer.    6:14 AM D-dimer 1.07.  Will check CT PE.  Patient signed out to oncoming team, who will continue care.  Final Clinical Impressions(s) / ED Diagnoses   Final diagnoses:  None    ED Discharge Orders    None       Montine Circle, PA-C 02/23/19 [id number]    Palumbo, April, MD 02/23/19 8756

## 2019-02-23 NOTE — ED Triage Notes (Signed)
Left sided chest pain for about 7 days. Pt arrives after leaving [facility] ED, labs/ekg has been done prior.

## 2019-05-23 ENCOUNTER — Ambulatory Visit: Payer: Medicaid Other | Attending: Internal Medicine

## 2019-05-23 DIAGNOSIS — Z20822 Contact with and (suspected) exposure to covid-19: Secondary | ICD-10-CM

## 2019-05-24 DIAGNOSIS — R300 Dysuria: Secondary | ICD-10-CM | POA: Diagnosis not present

## 2019-05-24 DIAGNOSIS — E782 Mixed hyperlipidemia: Secondary | ICD-10-CM | POA: Diagnosis not present

## 2019-05-24 DIAGNOSIS — E282 Polycystic ovarian syndrome: Secondary | ICD-10-CM | POA: Diagnosis not present

## 2019-05-24 DIAGNOSIS — R7303 Prediabetes: Secondary | ICD-10-CM | POA: Diagnosis not present

## 2019-05-24 DIAGNOSIS — K219 Gastro-esophageal reflux disease without esophagitis: Secondary | ICD-10-CM | POA: Diagnosis not present

## 2019-05-24 DIAGNOSIS — R635 Abnormal weight gain: Secondary | ICD-10-CM | POA: Diagnosis not present

## 2019-05-24 DIAGNOSIS — E663 Overweight: Secondary | ICD-10-CM | POA: Diagnosis not present

## 2019-05-24 DIAGNOSIS — F419 Anxiety disorder, unspecified: Secondary | ICD-10-CM | POA: Diagnosis not present

## 2019-05-24 DIAGNOSIS — R61 Generalized hyperhidrosis: Secondary | ICD-10-CM | POA: Diagnosis not present

## 2019-05-24 LAB — NOVEL CORONAVIRUS, NAA: SARS-CoV-2, NAA: NOT DETECTED

## 2019-06-23 DIAGNOSIS — K219 Gastro-esophageal reflux disease without esophagitis: Secondary | ICD-10-CM | POA: Diagnosis not present

## 2019-06-23 DIAGNOSIS — R0789 Other chest pain: Secondary | ICD-10-CM | POA: Diagnosis not present

## 2019-06-23 DIAGNOSIS — F419 Anxiety disorder, unspecified: Secondary | ICD-10-CM | POA: Diagnosis not present

## 2019-06-23 DIAGNOSIS — E282 Polycystic ovarian syndrome: Secondary | ICD-10-CM | POA: Diagnosis not present

## 2019-06-23 DIAGNOSIS — R7303 Prediabetes: Secondary | ICD-10-CM | POA: Diagnosis not present

## 2019-06-23 DIAGNOSIS — E782 Mixed hyperlipidemia: Secondary | ICD-10-CM | POA: Diagnosis not present

## 2019-08-23 ENCOUNTER — Emergency Department (HOSPITAL_COMMUNITY): Payer: BC Managed Care – PPO

## 2019-08-23 ENCOUNTER — Other Ambulatory Visit: Payer: Self-pay

## 2019-08-23 ENCOUNTER — Encounter (HOSPITAL_COMMUNITY): Payer: Self-pay

## 2019-08-23 ENCOUNTER — Emergency Department (HOSPITAL_COMMUNITY)
Admission: EM | Admit: 2019-08-23 | Discharge: 2019-08-24 | Disposition: A | Discharge status: Home / Self Care | Payer: BC Managed Care – PPO | Attending: Emergency Medicine | Admitting: Emergency Medicine

## 2019-08-23 DIAGNOSIS — R079 Chest pain, unspecified: Secondary | ICD-10-CM | POA: Diagnosis not present

## 2019-08-23 DIAGNOSIS — Z20822 Contact with and (suspected) exposure to covid-19: Secondary | ICD-10-CM | POA: Insufficient documentation

## 2019-08-23 DIAGNOSIS — R0789 Other chest pain: Secondary | ICD-10-CM | POA: Insufficient documentation

## 2019-08-23 DIAGNOSIS — R0602 Shortness of breath: Secondary | ICD-10-CM | POA: Diagnosis not present

## 2019-08-23 LAB — BASIC METABOLIC PANEL WITH GFR
Anion gap: 9 (ref 5–15)
BUN: 8 mg/dL (ref 6–20)
CO2: 26 mmol/L (ref 22–32)
Calcium: 9.2 mg/dL (ref 8.9–10.3)
Chloride: 102 mmol/L (ref 98–111)
Creatinine, Ser: 0.67 mg/dL (ref 0.44–1.00)
GFR calc Af Amer: >60 mL/min
GFR calc non Af Amer: >60 mL/min
Glucose, Bld: 125 mg/dL — ABNORMAL HIGH (ref 70–99)
Potassium: 3.7 mmol/L (ref 3.5–5.1)
Sodium: 137 mmol/L (ref 135–145)

## 2019-08-23 LAB — CBC
HCT: 39.8 % (ref 36.0–46.0)
Hemoglobin: 12.5 g/dL (ref 12.0–15.0)
MCH: 25.8 pg — ABNORMAL LOW (ref 26.0–34.0)
MCHC: 31.4 g/dL (ref 30.0–36.0)
MCV: 82.2 fL (ref 80.0–100.0)
Platelets: 326 K/uL (ref 150–400)
RBC: 4.84 MIL/uL (ref 3.87–5.11)
RDW: 14.6 % (ref 11.5–15.5)
WBC: 12 K/uL — ABNORMAL HIGH (ref 4.0–10.5)
nRBC: 0 % (ref 0.0–0.2)

## 2019-08-23 LAB — I-STAT BETA HCG BLOOD, ED (MC, WL, AP ONLY): I-stat hCG, quantitative: <5 m[IU]/mL (ref <5)

## 2019-08-23 LAB — D-DIMER, QUANTITATIVE: D-Dimer, Quant: 0.6 ug/mL-FEU — ABNORMAL HIGH (ref 0.00–0.50)

## 2019-08-23 LAB — TROPONIN I (HIGH SENSITIVITY)
Troponin I (High Sensitivity): 3 ng/L (ref <18)
Troponin I (High Sensitivity): 3 ng/L (ref <18)

## 2019-08-23 MED ORDER — SODIUM CHLORIDE (PF) 0.9 % IJ SOLN
INTRAMUSCULAR | Status: AC
  Filled 2019-08-23: qty 50

## 2019-08-23 MED ORDER — IOHEXOL 350 MG/ML SOLN
100.0000 mL | Freq: Once | INTRAVENOUS | Status: AC | PRN
  Administered 2019-08-23: 100 mL via INTRAVENOUS

## 2019-08-23 NOTE — ED Triage Notes (Addendum)
Intermittent sternal CP that radiates to back since 1800 today. No pain on palpation. Pt also sts arm / leg / mouth tingling that just started pta.

## 2019-08-23 NOTE — Discharge Instructions (Addendum)
Your labwork and imaging was reassuring today Please follow up with your PCP regarding your ED visit  We have tested you for COVID 19 per your request - please stay home until you receive your results however given you have been vaccinated the likelihood is low Return to the ED IMMEDIATELY for any worsening symptoms including worsening chest pain, shortness of breath, weakness or numbness on one side of your body, coughing up blood, passing out.

## 2019-08-23 NOTE — ED Provider Notes (Signed)
[facility]-EMERGENCY DEPT Provider Note   CSN: [id number] Arrival date & time: 08/23/19  2049     History Chief Complaint  Patient presents with  . Chest Pain    Kathryn Cruz is a 29 y.o. female who presents to the ED with complaint of sudden onset, intermittent, substernal chest pain wrapping around to left side that began this morning. Pt reports the pain will last several seconds before dissipating. She reports nausea as well, no emesis. Pt states that all day she has felt her lips "tingling" however PTA to the ED tonight she began having tingling to her right arm and leg which has since dissipated. No weakness or numbness noted. Pt does not have chest pain currently. No FHx of CAD. No personal hx of CAD or MI. No hx of DVT/PE. No recent prolonged travel or immobilization. No exogenous hormone use. No hemoptysis. No active malignancy. Pt is a never smoker. She has been vaccinated against COVID 19. Pt denies fevers, chills, SOB, palpitations, vomiting, diaphoresis, leg swelling, or any other associated symptoms.   The history is provided by the patient and medical records.    HPI: A 29 year old patient presents for evaluation of chest pain. Initial onset of pain was approximately 1-3 hours ago. The patient's chest pain is not worse with exertion. The patient complains of nausea. The patient's chest pain is middle- or left-sided, is not well-localized, is not described as heaviness/pressure/tightness, is not sharp and does not radiate to the arms/jaw/neck. The patient denies diaphoresis. The patient has no history of stroke, has no history of peripheral artery disease, has not smoked in the past 90 days, denies any history of treated diabetes, has no relevant family history of coronary artery disease (first degree relative at less than age 19), is not hypertensive, has no history of hypercholesterolemia and does not have an elevated BMI (>=30).   Past Medical  History:  Diagnosis Date  . Gestational diabetes    diet controlled, glyburide  . Headache   . History of chlamydia   . Infection    uti    Patient Active Problem List   Diagnosis Date Noted  . Normal labor 05/08/2016    Past Surgical History:  Procedure Laterality Date  . NO PAST SURGERIES    . WISDOM TOOTH EXTRACTION       OB History    Gravida  2   Para  2   Term  2   Preterm      AB      Living  2     SAB      TAB      Ectopic      Multiple  0   Live Births  2           Family History  Problem Relation Age of Onset  . Hypertension Mother   . Diabetes Mother   . Hyperlipidemia Mother   . Hypertension Maternal Grandmother   . Cancer Paternal Grandmother        throat    Social History   Tobacco Use  . Smoking status: Never Smoker  . Smokeless tobacco: Never Used  Substance Use Topics  . Alcohol use: No  . Drug use: No    Home Medications Prior to Admission medications   Medication Sig Start Date End Date Taking? Authorizing Provider  acetaminophen (TYLENOL) 500 MG tablet Take 1,000 mg by mouth every 6 (six) hours as needed for mild pain or moderate pain. For  pain    Yes [provider]    Allergies    Patient has no known allergies.  Review of Systems   Review of Systems  Constitutional: Positive for fatigue. Negative for chills and fever.  Respiratory: Negative for cough and shortness of breath.   Cardiovascular: Positive for chest pain.  Gastrointestinal: Positive for nausea. Negative for abdominal pain, constipation, diarrhea and vomiting.  All other systems reviewed and are negative.   Physical Exam Updated Vital Signs BP 116/61   Pulse 69   Temp 98.2 F (36.8 C) (Oral)   Resp 16   Ht 5' (1.524 m)   Wt 65.3 kg   SpO2 99%   BMI 28.12 kg/m   Physical Exam Vitals and nursing note reviewed.  Constitutional:      Appearance: She is not ill-appearing or diaphoretic.  HENT:     Head: Normocephalic and  atraumatic.  Eyes:     Conjunctiva/sclera: Conjunctivae normal.  Cardiovascular:     Rate and Rhythm: Normal rate and regular rhythm.     Pulses:          Radial pulses are 2+ on the right side and 2+ on the left side.       Dorsalis pedis pulses are 2+ on the right side and 2+ on the left side.     Heart sounds: Normal heart sounds.  Pulmonary:     Effort: Pulmonary effort is normal.     Breath sounds: Normal breath sounds. No decreased breath sounds, wheezing, rhonchi or rales.  Chest:     Chest wall: No tenderness.  Abdominal:     Palpations: Abdomen is soft.     Tenderness: There is no abdominal tenderness. There is no guarding or rebound.  Musculoskeletal:     Cervical back: Neck supple.  Skin:    General: Skin is warm and dry.  Neurological:     Mental Status: She is alert.     Comments: CN 3-12 grossly intact A&O x4 GCS 15 Sensation and strength intact Gait nonataxic including with tandem walking Coordination with finger-to-nose WNL Neg romberg, neg pronator drift     ED Results / Procedures / Treatments   Labs (all labs ordered are listed, but only abnormal results are displayed) Labs Reviewed  BASIC METABOLIC PANEL - Abnormal; Notable for the following components:      Result Value   Glucose, Bld 125 (*)    All other components within normal limits  CBC - Abnormal; Notable for the following components:   WBC 12.0 (*)    MCH 25.8 (*)    All other components within normal limits  D-DIMER, QUANTITATIVE (NOT AT [facility]) - Abnormal; Notable for the following components:   D-Dimer, Quant 0.60 (*)    All other components within normal limits  SARS CORONAVIRUS 2 (TAT 6-24 HRS)  I-STAT BETA HCG BLOOD, ED ([facility], [facility], [facility] ONLY)  TROPONIN I (HIGH SENSITIVITY)  TROPONIN I (HIGH SENSITIVITY)    EKG EKG Interpretation  Date/Time:  Wednesday Aug 23 2019 21:00:10 EDT Ventricular Rate:  90 PR Interval:    QRS Duration: 86 QT Interval:  345 QTC Calculation: 423 R  Axis:   72 Text Interpretation: Sinus rhythm Minimal ST depression, diffuse leads Confirmed by Virgina Norfolk [phone]) on 08/23/2019 9:51:10 PM   Radiology DG Chest 2 View  Result Date: 08/23/2019 CLINICAL DATA:  Chest pain EXAM: CHEST - 2 VIEW COMPARISON:  02/22/2019 FINDINGS: The heart size and mediastinal contours are within normal limits.  Both lungs are clear. The visualized skeletal structures are unremarkable. IMPRESSION: No active cardiopulmonary disease. Electronically Signed   By: Donavan Foil M.D.   On: 08/23/2019 21:28   CT Angio Chest PE W/Cm &/Or Wo Cm  Result Date: 08/23/2019 CLINICAL DATA:  Mid chest pain radiating to back, began earlier with shortness of breath EXAM: CT ANGIOGRAPHY CHEST WITH CONTRAST TECHNIQUE: Multidetector CT imaging of the chest was performed using the standard protocol during bolus administration of intravenous contrast. Multiplanar CT image reconstructions and MIPs were obtained to evaluate the vascular anatomy. CONTRAST:  121mL OMNIPAQUE IOHEXOL 350 MG/ML SOLN COMPARISON:  Chest radiograph 08/23/2019, CTA 02/23/2019 FINDINGS: Cardiovascular: Satisfactory opacification of the pulmonary arteries. No central or lobar filling defects. More distal evaluation limited by respiratory motion. Normal heart size. No pericardial effusion. Normal appearance of the aorta and proximal great vessels. Mediastinum/Nodes: No mediastinal fluid or gas. Normal thyroid gland and thoracic inlet. No acute abnormality of the trachea or esophagus. No worrisome mediastinal, hilar or axillary adenopathy. Lungs/Pleura: Respiratory motion artifact may limit parenchymal evaluation and detection of subtle anomalies. No consolidation, features of edema, pneumothorax, or effusion. No suspicious pulmonary nodules or masses. Upper Abdomen: No acute abnormalities present in the visualized portions of the upper abdomen. Musculoskeletal: No chest wall mass or suspicious bone lesions identified. Review of the  MIP images confirms the above findings. IMPRESSION: 1. Respiratory motion artifact may limit detection of smaller segmental and subsegmental pulmonary emboli and detection of subtle anomalies in the chest. 2. No evidence of pulmonary embolus to the lobar level. No other acute intrathoracic process. Electronically Signed   By: Lovena Le M.D.   On: 08/23/2019 23:28    Procedures Procedures (including critical care time)  Medications Ordered in ED Medications  sodium chloride (PF) 0.9 % injection (has no administration in time range)  iohexol (OMNIPAQUE) 350 MG/ML injection 100 mL (100 mLs Intravenous Contrast Given 08/23/19 2313)    ED Course  I have reviewed the triage vital signs and the nursing notes.  Pertinent labs & imaging results that were available during my care of the patient were reviewed by me and considered in my medical decision making (see chart for details).  Clinical Course as of Aug 22 2348  Wed Aug 23, 2019  2239 D-Dimer, Quant(!): 0.60 [MV]    Clinical Course User Index [MV] Eustaquio Maize, PA-C   MDM Rules/Calculators/A&P HEAR Score: 2                    29 year old female who presents to the ED today complaining of intermittent substernal chest pain that wraps around to the left side that began this morning.  To arrival began having some tingling in her right arm and right leg however this is dissipated and only lasted several seconds.  History of MI in the past.  No family history.  No history of DVT/PE however on arrival patient mildly tachycardic.  Is afebrile nontachypneic.  She is not having any active chest pain.  She has no focal neuro deficits on exam and has equal strength and sensation in her bilateral lower extremities.  She has equal pulses.  Her blood pressure is stabilized.  I doubt dissection.  Will work-up for ACS at this time as well as D-dimer.  EKG with minimal ST depression in diffuse leads CXR clear Troponin of 3, will repeat D dimer  elevated at 0.60. Will obtain CTA at this time.  CBC with leukocytosis of 12 however pt without  symptoms to suggest infection etiology BMP with glucose 125. No other abnormalities.   CTA negative for PE Repeat Trop 3  Upon reeval pt resting comfortably. No chest pain. She does mention that a coworker tested positive for COVID last week and she would like to be tested. She has been vaccinated however. I have low suspicion for COVID 19 however will do send out test for reassurance. Pt advised to self isolate until she receives her results. Pt to follow up with PCP for further eval regarding ED visit today. Strict return precautions discussed. Pt is in agreement with plan and stable for discharge home.   This note was prepared using Dragon voice recognition software and may include unintentional dictation errors due to the inherent limitations of voice recognition software.   Final Clinical Impression(s) / ED Diagnoses Final diagnoses:  Nonspecific chest pain    Rx / DC Orders ED Discharge Orders    None       Discharge Instructions     Your labwork and imaging was reassuring today Please follow up with your PCP regarding your ED visit  We have tested you for COVID 19 per your request - please stay home until you receive your results however given you have been vaccinated the likelihood is low Return to the ED IMMEDIATELY for any worsening symptoms including worsening chest pain, shortness of breath, weakness or numbness on one side of your body, coughing up blood, passing out.        Tanda Rockers, PA-C 08/23/19 2350    Virgina Norfolk, DO 08/24/19 [phone]

## 2019-08-23 NOTE — ED Notes (Signed)
Pt transported to CT ?

## 2019-08-24 LAB — SARS CORONAVIRUS 2 (TAT 6-24 HRS): SARS Coronavirus 2: NEGATIVE

## 2020-01-10 DIAGNOSIS — E282 Polycystic ovarian syndrome: Secondary | ICD-10-CM | POA: Diagnosis not present

## 2020-01-10 DIAGNOSIS — K219 Gastro-esophageal reflux disease without esophagitis: Secondary | ICD-10-CM | POA: Diagnosis not present

## 2020-01-10 DIAGNOSIS — F419 Anxiety disorder, unspecified: Secondary | ICD-10-CM | POA: Diagnosis not present

## 2020-01-10 DIAGNOSIS — E782 Mixed hyperlipidemia: Secondary | ICD-10-CM | POA: Diagnosis not present

## 2020-01-10 DIAGNOSIS — R7303 Prediabetes: Secondary | ICD-10-CM | POA: Diagnosis not present

## 2020-03-01 DIAGNOSIS — M542 Cervicalgia: Secondary | ICD-10-CM | POA: Diagnosis not present

## 2020-03-01 DIAGNOSIS — Z1329 Encounter for screening for other suspected endocrine disorder: Secondary | ICD-10-CM | POA: Diagnosis not present

## 2020-03-01 DIAGNOSIS — Z23 Encounter for immunization: Secondary | ICD-10-CM | POA: Diagnosis not present

## 2020-03-01 DIAGNOSIS — Z136 Encounter for screening for cardiovascular disorders: Secondary | ICD-10-CM | POA: Diagnosis not present

## 2020-03-01 DIAGNOSIS — Z0001 Encounter for general adult medical examination with abnormal findings: Secondary | ICD-10-CM | POA: Diagnosis not present

## 2020-03-01 DIAGNOSIS — Z131 Encounter for screening for diabetes mellitus: Secondary | ICD-10-CM | POA: Diagnosis not present

## 2020-03-20 DIAGNOSIS — K219 Gastro-esophageal reflux disease without esophagitis: Secondary | ICD-10-CM | POA: Diagnosis not present

## 2020-03-20 DIAGNOSIS — R7303 Prediabetes: Secondary | ICD-10-CM | POA: Diagnosis not present

## 2020-03-20 DIAGNOSIS — S39012A Strain of muscle, fascia and tendon of lower back, initial encounter: Secondary | ICD-10-CM | POA: Diagnosis not present

## 2020-03-20 DIAGNOSIS — F419 Anxiety disorder, unspecified: Secondary | ICD-10-CM | POA: Diagnosis not present

## 2020-03-20 DIAGNOSIS — E282 Polycystic ovarian syndrome: Secondary | ICD-10-CM | POA: Diagnosis not present

## 2020-03-20 DIAGNOSIS — M542 Cervicalgia: Secondary | ICD-10-CM | POA: Diagnosis not present

## 2020-03-20 DIAGNOSIS — E782 Mixed hyperlipidemia: Secondary | ICD-10-CM | POA: Diagnosis not present

## 2020-04-01 ENCOUNTER — Encounter: Payer: Self-pay | Admitting: Physical Therapy

## 2020-04-01 ENCOUNTER — Ambulatory Visit: Payer: BC Managed Care – PPO | Attending: Physician Assistant | Admitting: Physical Therapy

## 2020-04-01 ENCOUNTER — Other Ambulatory Visit: Payer: Self-pay

## 2020-04-01 DIAGNOSIS — M542 Cervicalgia: Secondary | ICD-10-CM | POA: Diagnosis present

## 2020-04-01 DIAGNOSIS — M6281 Muscle weakness (generalized): Secondary | ICD-10-CM | POA: Diagnosis present

## 2020-04-01 DIAGNOSIS — M545 Low back pain, unspecified: Secondary | ICD-10-CM | POA: Diagnosis present

## 2020-04-01 DIAGNOSIS — G8929 Other chronic pain: Secondary | ICD-10-CM | POA: Insufficient documentation

## 2020-04-02 ENCOUNTER — Encounter: Payer: Self-pay | Admitting: Physical Therapy

## 2020-04-02 NOTE — Patient Instructions (Signed)
Access Code: [email or web address] URL: [email or web address] Date: 04/01/2020 Prepared by: Rosana Hoes  Exercises Supine Cervical Retraction with Towel - 2 x daily - 7 x weekly - 10 reps - 5 seconds hold Shoulder External Rotation and Scapular Retraction - 2 x daily - 7 x weekly - 10 reps - 5 seconds hold Supine Lower Trunk Rotation - 2 x daily - 7 x weekly - 10 reps - 3-5 seconds hold Cat-Camel - 2 x daily - 7 x weekly - 10 reps - 3-5 seconds hold Tail Wag - 2 x daily - 7 x weekly - 10 reps - 3-5 seconds hold Child's Pose Stretch - 2 x daily - 7 x weekly - 2 reps - 20 seconds hold
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
## 2020-04-18 ENCOUNTER — Ambulatory Visit: Payer: BC Managed Care – PPO | Admitting: Physical Therapy

## 2020-04-18 ENCOUNTER — Encounter: Payer: Self-pay | Admitting: Physical Therapy

## 2020-04-18 ENCOUNTER — Other Ambulatory Visit: Payer: Self-pay

## 2020-04-18 DIAGNOSIS — M545 Low back pain, unspecified: Secondary | ICD-10-CM

## 2020-04-18 DIAGNOSIS — G8929 Other chronic pain: Secondary | ICD-10-CM

## 2020-04-18 DIAGNOSIS — M6281 Muscle weakness (generalized): Secondary | ICD-10-CM

## 2020-04-18 DIAGNOSIS — M542 Cervicalgia: Secondary | ICD-10-CM

## 2020-04-18 NOTE — Therapy (Signed)
[facility] Outpatient Rehabilitation [facility] [address] [place], [place], [address] Phone: [phone]   Fax:  [phone]  Physical Therapy Treatment  Patient Details  Name: Kathryn Cruz MRN: [id number] Date of Birth: 11-06-1990 Referring Provider (PT): Norm Salt, [place]   Encounter Date: 04/18/2020   PT End of Session - 04/18/20 1709    Visit Number 2    Number of Visits 6    Date for PT Re-Evaluation 05/13/20    Authorization Type BCBS    PT Start Time 1700    PT Stop Time 1740    PT Time Calculation (min) 40 min    Activity Tolerance Patient tolerated treatment well    Behavior During Therapy [facility] for tasks assessed/performed           Past Medical History:  Diagnosis Date  . Gestational diabetes    diet controlled, glyburide  . Headache   . History of chlamydia   . Infection    uti    Past Surgical History:  Procedure Laterality Date  . NO PAST SURGERIES    . WISDOM TOOTH EXTRACTION      There were no vitals filed for this visit.   Subjective Assessment - 04/18/20 1703    Subjective Patient reports she is doing well, things are going pretty good and she is feeling better.    Patient Stated Goals Patient would like help with her back and neck pain    Currently in Pain? No/denies              [facility] PT Assessment - 04/18/20 0001      AROM   Cervical - Right Side Bend 45    Cervical - Left Side Bend 30    Lumbar Flexion 75%    Lumbar - Right Rotation WFL    Lumbar - Left Rotation WFL                         [facility] Adult PT Treatment/Exercise - 04/18/20 0001      Neck Exercises: Theraband   Shoulder Extension 20 reps   2 sets   Shoulder Extension Limitations yellow, money    Horizontal ABduction 20 reps   2 sets   Horizontal ABduction Limitations yellow, supine      Neck Exercises: Seated   Neck Retraction 10 reps;3 secs   2 sets     Lumbar Exercises: Stretches   Lower Trunk Rotation  Limitations 10 x 3 sec each   figure-4 position   Piriformis Stretch 2 reps;20 seconds    Other Lumbar Stretch Exercise Child's pose x 20 sec fwd and lateral each    Other Lumbar Stretch Exercise Sidelying thoracic rotation 10 x 5 sec each      Lumbar Exercises: Aerobic   Nustep L4 x 5 min with UE/LE      Lumbar Exercises: Supine   Bridge 10 reps;3 seconds   2 sets     Lumbar Exercises: Quadruped   Madcat/Old Horse 10 reps    Madcat/Old Horse Limitations cueing for proper lumbopelvic control    Other Quadruped Lumbar Exercises Tail wag x 10                  PT Education - 04/18/20 1709    Education Details HEP, FOTO    Person(s) Educated Patient    Methods Explanation    Comprehension Verbalized understanding;Need further instruction  PT Short Term Goals - 04/18/20 1742      PT SHORT TERM GOAL #1   Title Patient will be independent with HEP to progress with PT    Time 3    Period Weeks    Status On-going    Target Date 04/22/20      PT SHORT TERM GOAL #2   Title PT will review FOTO with patient by 3rd visit    Time 3    Period Weeks    Status Achieved    Target Date 04/22/20      PT SHORT TERM GOAL #3   Title Patient will exhibit lumbar flexion WFL to improve performing household tasks    Time 3    Period Weeks    Status On-going    Target Date 04/22/20             PT Long Term Goals - 04/01/20 1711      PT LONG TERM GOAL #1   Title Patient will be I with final HEP to maintain progress from PT    Time 6    Period Weeks    Status New    Target Date 05/13/20      PT LONG TERM GOAL #2   Title Patient will exhibit improved core and periscapular strength >/= 4/5 MMT to improve posture and sitting tolerance    Time 6    Period Weeks    Status New    Target Date 05/13/20      PT LONG TERM GOAL #3   Title Patient will demonstrate cervical and lumbar AROM WFL and non-painful in order to take care of child without limitation    Time 6     Period Weeks    Status New    Target Date 05/13/20      PT LONG TERM GOAL #4   Title Patient will report </= 2/10 pain with all activity to improve walking and standing ability    Time 6    Period Weeks    Status New    Target Date 05/13/20      PT LONG TERM GOAL #5   Title Patient will report improved functional status >/= 73% for lumbar and 82% for neck on FOTO    Time 6    Period Weeks    Status New    Target Date 05/13/20                 Plan - 04/18/20 1716    Clinical Impression Statement Patient tolerated therapy well with no adverse effects. Therapy focused on continued mobility and strengthening exercises with good tolerance. Patient continues to have difficulty with lumbopelvic control and requires consistent cueing for proper form. She did not report any pain with therapy this visit and seems to be improving overall. Range of motionhas imrpoved but seems to continue having right side upper trap tightness. Patient would benefit from continued skilled PT to progress her mobility and strength in order to reduce pain and maximize functional level.    PT Treatment/Interventions ADLs/Self Care Home Management;Cryotherapy;Electrical Stimulation;Iontophoresis 4mg /ml Dexamethasone;Moist Heat;Traction;Ultrasound;Neuromuscular re-education;Balance training;Therapeutic exercise;Therapeutic activities;Functional mobility training;Stair training;Gait training;Patient/family education;Manual techniques;Dry needling;Passive range of motion;Taping;Spinal Manipulations;Joint Manipulations    PT Next Visit Plan Review HEP and progresss PRN, manual/dry needling/modalities for lumbar/cervical muscle tension, postural and core strengthening, lifting mechanics    PT Home Exercise Plan [id number]: supine chin tuck, no money with scap retraction, LTR, cat camel, tail wag, child's pose stretch  Consulted and Agree with Plan of Care Patient           Patient will benefit from skilled  therapeutic intervention in order to improve the following deficits and impairments:  Decreased range of motion,Postural dysfunction,Decreased strength,Pain,Decreased activity tolerance,Increased muscle spasms  Visit Diagnosis: Cervicalgia  Chronic bilateral low back pain, unspecified whether sciatica present  Muscle weakness (generalized)     Problem List Patient Active Problem List   Diagnosis Date Noted  . Normal labor 05/08/2016    Rosana Hoes, PT, DPT, LAT, ATC 04/18/20  5:46 PM Phone: [phone] Fax: [phone]   [facility] Outpatient Rehabilitation [facility] [address] [place], [place], [address] Phone: [phone]   Fax:  [phone]  Name: Megean Fabio MRN: [id number] Date of Birth: 01-Aug-1990

## 2020-05-09 ENCOUNTER — Ambulatory Visit: Payer: BC Managed Care – PPO | Admitting: Physical Therapy

## 2020-05-14 ENCOUNTER — Ambulatory Visit: Payer: BC Managed Care – PPO | Attending: Physician Assistant

## 2020-05-14 ENCOUNTER — Other Ambulatory Visit: Payer: Self-pay

## 2020-05-14 DIAGNOSIS — M545 Low back pain, unspecified: Secondary | ICD-10-CM | POA: Diagnosis present

## 2020-05-14 DIAGNOSIS — M542 Cervicalgia: Secondary | ICD-10-CM

## 2020-05-14 DIAGNOSIS — M6281 Muscle weakness (generalized): Secondary | ICD-10-CM | POA: Diagnosis present

## 2020-05-14 DIAGNOSIS — G8929 Other chronic pain: Secondary | ICD-10-CM | POA: Diagnosis present
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
## 2020-05-23 ENCOUNTER — Ambulatory Visit: Payer: Medicaid Other | Admitting: Physical Therapy

## 2020-08-03 IMAGING — CT CT ANGIO CHEST
2 of 6 series · 19 of 36 positions shown · IV contrast (OMNIPAQUE 350)
Comparison: None.

CLINICAL DATA: Left-sided chest pain x7 days. Elevated D-dimer.

EXAM:
CT ANGIOGRAPHY CHEST WITH CONTRAST
TECHNIQUE: Multidetector CT imaging of the chest was performed using the
standard protocol during bolus administration of intravenous
contrast. Multiplanar CT image reconstructions and MIPs were
obtained to evaluate the vascular anatomy.
CONTRAST:  100mL OMNIPAQUE IOHEXOL 350 MG/ML SOLN

[Series 5: thins · axial · 0.62mm/px · z∈[+1520,+1700]mm · 18 of 202 slices shown]
[im 11/202  lung]
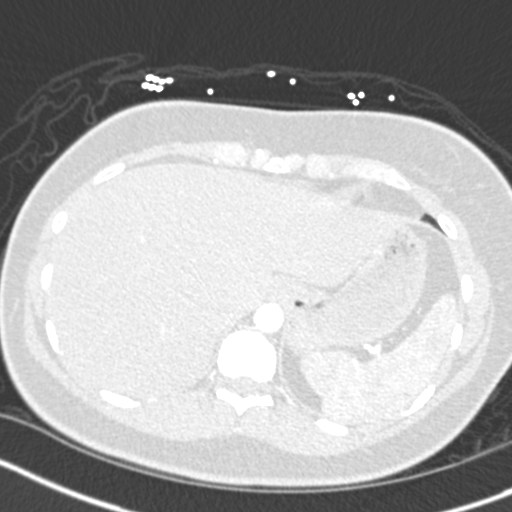
[im 21/202  mediastinal]
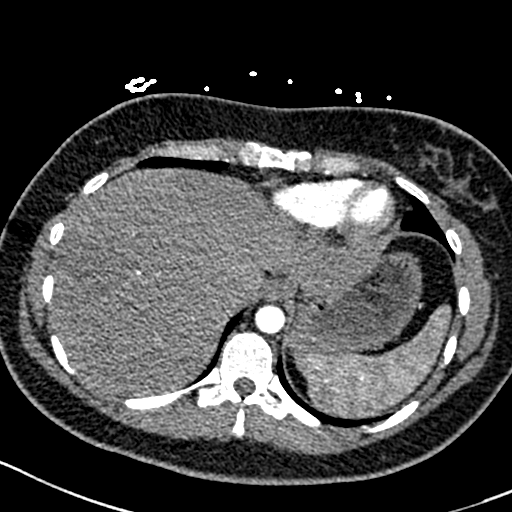
[im 31/202  lung]
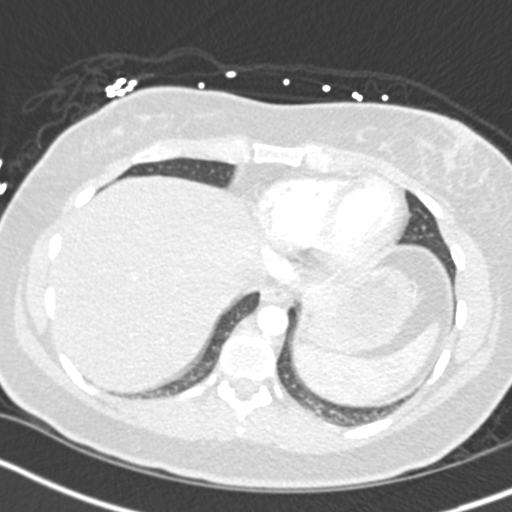
[im 41/202  mediastinal]
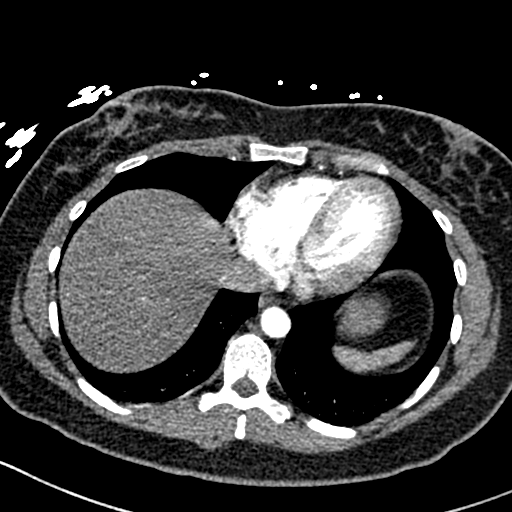
[im 51/202  lung]
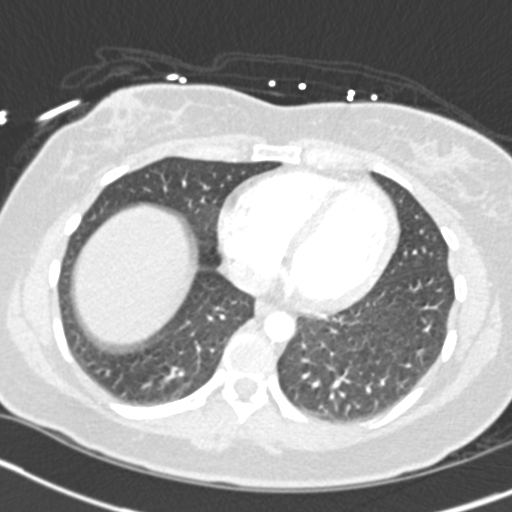
[im 61/202  mediastinal]
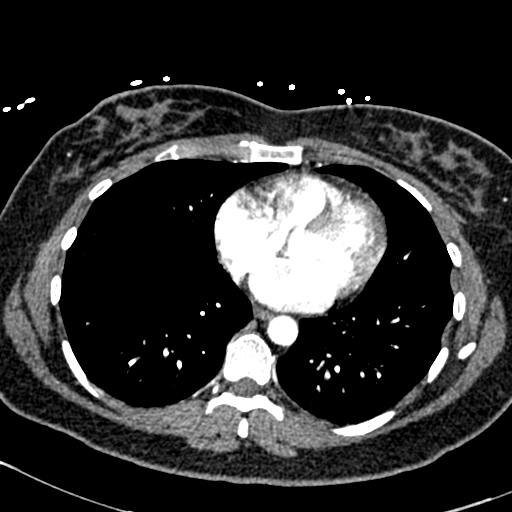
[im 71/202  lung]
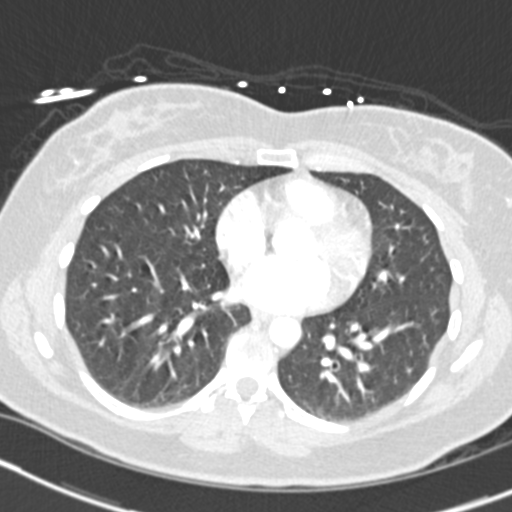
[im 81/202  mediastinal]
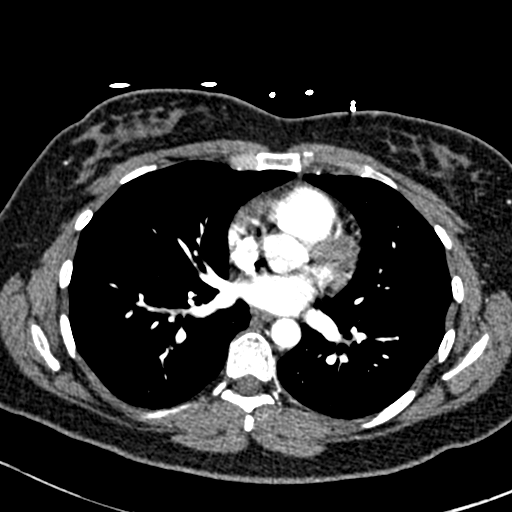
[im 91/202  lung]
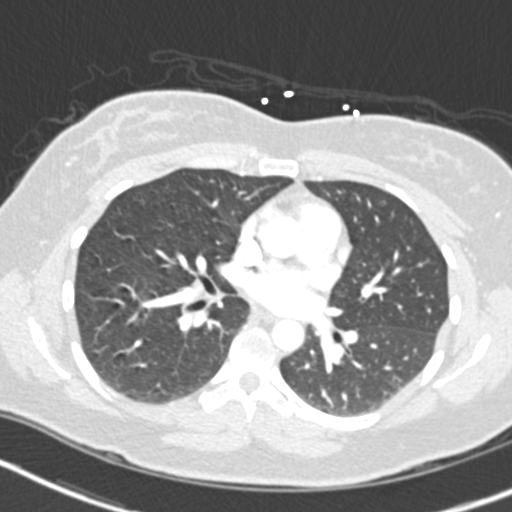
[im 111/202  mediastinal]
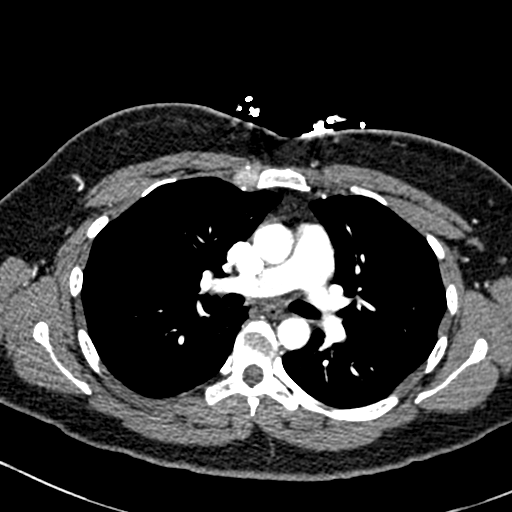
[im 121/202  lung]
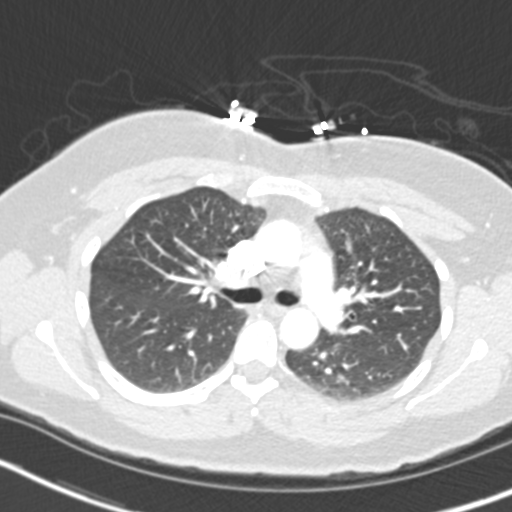
[im 131/202  mediastinal]
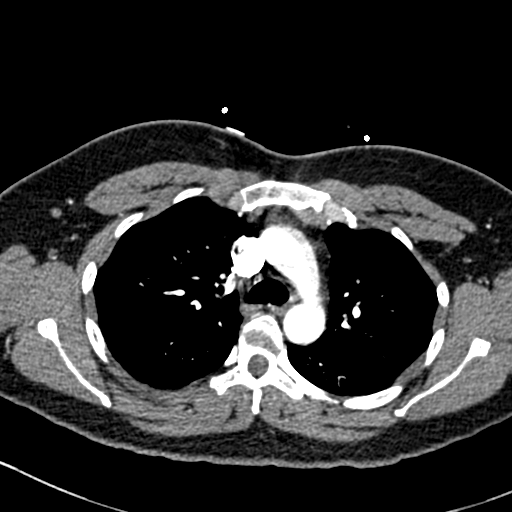
[im 141/202  lung]
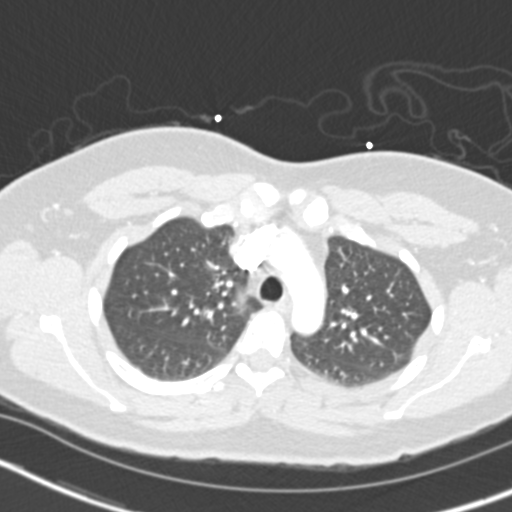
[im 151/202  mediastinal]
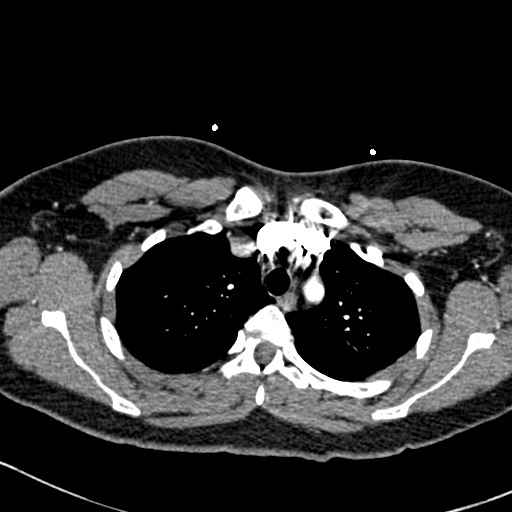
[im 161/202  lung]
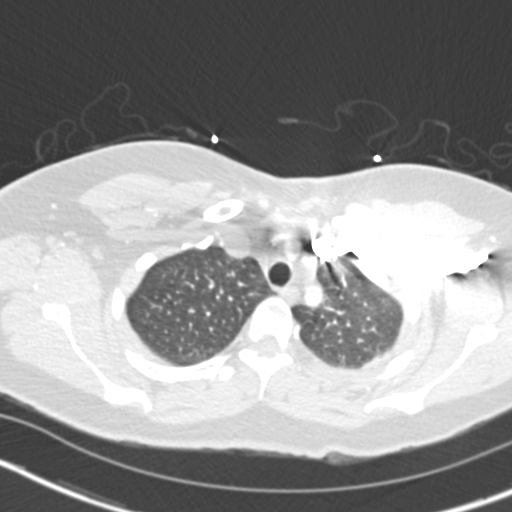
[im 171/202  mediastinal]
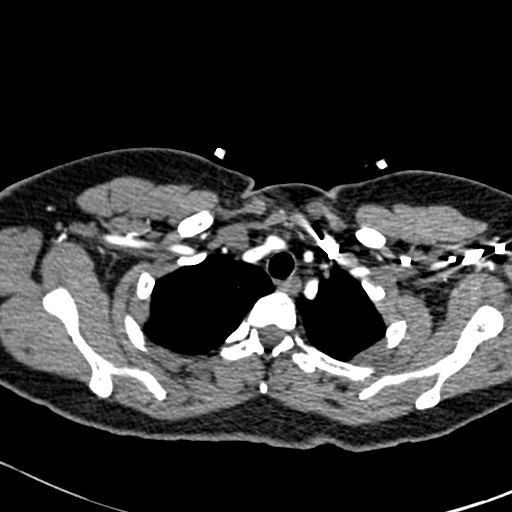
[im 181/202  lung]
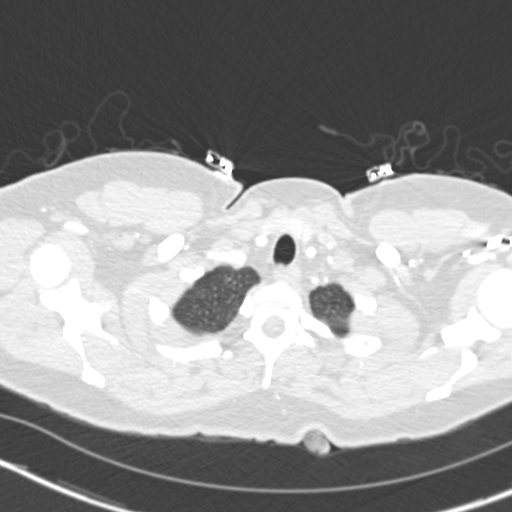
[im 191/202  mediastinal]
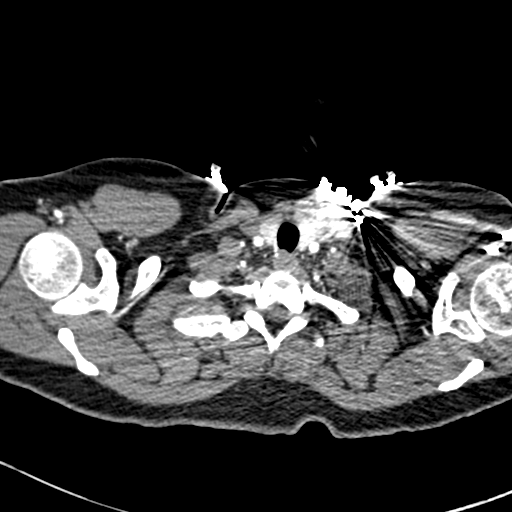

[Series 7: coronal mpr · coronal · 0.42mm/px · 1 of 115 slices shown]
[im 58/115  mediastinal]
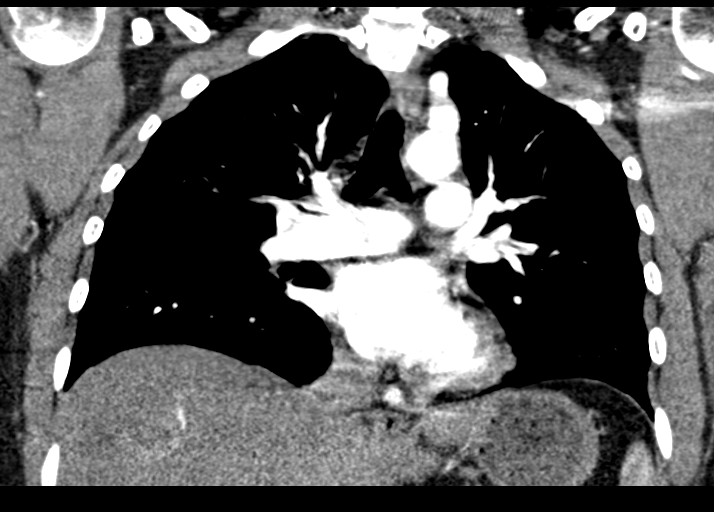

[19 of 36 positions shown; findings below may reference images not displayed]

FINDINGS: Cardiovascular: Evaluation for pulmonary emboli is limited by
respiratory motion artifact.There is no pulmonary embolus. The main
pulmonary artery is within normal limits for size. There is no CT
evidence of acute right heart strain. The visualized aorta is
normal. Heart size is normal, without pericardial effusion.

Mediastinum/Nodes:

--No mediastinal or hilar lymphadenopathy.

--No axillary lymphadenopathy.

--No supraclavicular lymphadenopathy.

--Normal thyroid gland.

--The esophagus is unremarkable

Lungs/Pleura: No pulmonary nodules or masses. No pleural effusion or
pneumothorax. No focal airspace consolidation. No focal pleural
abnormality.

Upper Abdomen: No acute abnormality.

Musculoskeletal: No chest wall abnormality. No acute or significant
osseous findings.

Review of the MIP images confirms the above findings.
IMPRESSION: 1. Evaluation for pulmonary emboli is limited by respiratory motion
artifact. Given this limitation, no PE was identified.
2. No acute intrathoracic process.

## 2020-09-02 DIAGNOSIS — N921 Excessive and frequent menstruation with irregular cycle: Secondary | ICD-10-CM | POA: Diagnosis not present

## 2020-09-02 DIAGNOSIS — R7303 Prediabetes: Secondary | ICD-10-CM | POA: Diagnosis not present

## 2020-09-02 DIAGNOSIS — E282 Polycystic ovarian syndrome: Secondary | ICD-10-CM | POA: Diagnosis not present

## 2020-09-02 DIAGNOSIS — F419 Anxiety disorder, unspecified: Secondary | ICD-10-CM | POA: Diagnosis not present

## 2020-09-02 DIAGNOSIS — E782 Mixed hyperlipidemia: Secondary | ICD-10-CM | POA: Diagnosis not present

## 2020-09-02 DIAGNOSIS — K219 Gastro-esophageal reflux disease without esophagitis: Secondary | ICD-10-CM | POA: Diagnosis not present

## 2020-09-26 DIAGNOSIS — Z113 Encounter for screening for infections with a predominantly sexual mode of transmission: Secondary | ICD-10-CM | POA: Diagnosis not present

## 2020-09-26 DIAGNOSIS — E282 Polycystic ovarian syndrome: Secondary | ICD-10-CM | POA: Diagnosis not present

## 2020-09-26 DIAGNOSIS — N939 Abnormal uterine and vaginal bleeding, unspecified: Secondary | ICD-10-CM | POA: Diagnosis not present

## 2020-09-26 DIAGNOSIS — R300 Dysuria: Secondary | ICD-10-CM | POA: Diagnosis not present

## 2020-09-26 DIAGNOSIS — N926 Irregular menstruation, unspecified: Secondary | ICD-10-CM | POA: Diagnosis not present

## 2020-09-30 DIAGNOSIS — K219 Gastro-esophageal reflux disease without esophagitis: Secondary | ICD-10-CM | POA: Diagnosis not present

## 2020-09-30 DIAGNOSIS — E782 Mixed hyperlipidemia: Secondary | ICD-10-CM | POA: Diagnosis not present

## 2020-09-30 DIAGNOSIS — F419 Anxiety disorder, unspecified: Secondary | ICD-10-CM | POA: Diagnosis not present

## 2020-09-30 DIAGNOSIS — N921 Excessive and frequent menstruation with irregular cycle: Secondary | ICD-10-CM | POA: Diagnosis not present

## 2020-09-30 DIAGNOSIS — R7303 Prediabetes: Secondary | ICD-10-CM | POA: Diagnosis not present

## 2020-09-30 DIAGNOSIS — E282 Polycystic ovarian syndrome: Secondary | ICD-10-CM | POA: Diagnosis not present

## 2020-09-30 DIAGNOSIS — E663 Overweight: Secondary | ICD-10-CM | POA: Diagnosis not present

## 2020-10-03 DIAGNOSIS — N939 Abnormal uterine and vaginal bleeding, unspecified: Secondary | ICD-10-CM | POA: Diagnosis not present

## 2020-10-03 DIAGNOSIS — R102 Pelvic and perineal pain: Secondary | ICD-10-CM | POA: Diagnosis not present

## 2020-10-03 DIAGNOSIS — E282 Polycystic ovarian syndrome: Secondary | ICD-10-CM | POA: Diagnosis not present

## 2020-10-08 DIAGNOSIS — E782 Mixed hyperlipidemia: Secondary | ICD-10-CM | POA: Diagnosis not present

## 2020-10-08 DIAGNOSIS — N939 Abnormal uterine and vaginal bleeding, unspecified: Secondary | ICD-10-CM | POA: Diagnosis not present

## 2020-10-08 DIAGNOSIS — R7303 Prediabetes: Secondary | ICD-10-CM | POA: Diagnosis not present

## 2020-10-08 DIAGNOSIS — R0602 Shortness of breath: Secondary | ICD-10-CM | POA: Diagnosis not present

## 2020-10-08 DIAGNOSIS — N921 Excessive and frequent menstruation with irregular cycle: Secondary | ICD-10-CM | POA: Diagnosis not present

## 2020-10-08 DIAGNOSIS — Z3202 Encounter for pregnancy test, result negative: Secondary | ICD-10-CM | POA: Diagnosis not present

## 2020-10-08 DIAGNOSIS — E663 Overweight: Secondary | ICD-10-CM | POA: Diagnosis not present

## 2020-10-08 DIAGNOSIS — K219 Gastro-esophageal reflux disease without esophagitis: Secondary | ICD-10-CM | POA: Diagnosis not present

## 2020-10-08 DIAGNOSIS — F419 Anxiety disorder, unspecified: Secondary | ICD-10-CM | POA: Diagnosis not present

## 2020-10-08 DIAGNOSIS — M2559 Pain in other specified joint: Secondary | ICD-10-CM | POA: Diagnosis not present

## 2020-10-08 DIAGNOSIS — E282 Polycystic ovarian syndrome: Secondary | ICD-10-CM | POA: Diagnosis not present

## 2020-10-22 DIAGNOSIS — K219 Gastro-esophageal reflux disease without esophagitis: Secondary | ICD-10-CM | POA: Diagnosis not present

## 2020-10-22 DIAGNOSIS — M2559 Pain in other specified joint: Secondary | ICD-10-CM | POA: Diagnosis not present

## 2020-10-22 DIAGNOSIS — Z0000 Encounter for general adult medical examination without abnormal findings: Secondary | ICD-10-CM | POA: Diagnosis not present

## 2020-10-22 DIAGNOSIS — F419 Anxiety disorder, unspecified: Secondary | ICD-10-CM | POA: Diagnosis not present

## 2020-10-22 DIAGNOSIS — E282 Polycystic ovarian syndrome: Secondary | ICD-10-CM | POA: Diagnosis not present

## 2020-10-22 DIAGNOSIS — R7303 Prediabetes: Secondary | ICD-10-CM | POA: Diagnosis not present

## 2020-10-22 DIAGNOSIS — E663 Overweight: Secondary | ICD-10-CM | POA: Diagnosis not present

## 2020-10-22 DIAGNOSIS — E782 Mixed hyperlipidemia: Secondary | ICD-10-CM | POA: Diagnosis not present

## 2020-10-22 DIAGNOSIS — R42 Dizziness and giddiness: Secondary | ICD-10-CM | POA: Diagnosis not present

## 2020-10-22 DIAGNOSIS — N921 Excessive and frequent menstruation with irregular cycle: Secondary | ICD-10-CM | POA: Diagnosis not present

## 2020-11-25 DIAGNOSIS — N939 Abnormal uterine and vaginal bleeding, unspecified: Secondary | ICD-10-CM | POA: Diagnosis not present

## 2020-11-25 DIAGNOSIS — Z3202 Encounter for pregnancy test, result negative: Secondary | ICD-10-CM | POA: Diagnosis not present

## 2020-11-27 DIAGNOSIS — E782 Mixed hyperlipidemia: Secondary | ICD-10-CM | POA: Diagnosis not present

## 2020-11-27 DIAGNOSIS — E663 Overweight: Secondary | ICD-10-CM | POA: Diagnosis not present

## 2020-11-27 DIAGNOSIS — R7303 Prediabetes: Secondary | ICD-10-CM | POA: Diagnosis not present

## 2020-11-27 DIAGNOSIS — K219 Gastro-esophageal reflux disease without esophagitis: Secondary | ICD-10-CM | POA: Diagnosis not present

## 2020-11-27 DIAGNOSIS — N921 Excessive and frequent menstruation with irregular cycle: Secondary | ICD-10-CM | POA: Diagnosis not present

## 2020-11-27 DIAGNOSIS — E282 Polycystic ovarian syndrome: Secondary | ICD-10-CM | POA: Diagnosis not present

## 2020-11-27 DIAGNOSIS — F419 Anxiety disorder, unspecified: Secondary | ICD-10-CM | POA: Diagnosis not present

## 2020-11-27 DIAGNOSIS — R42 Dizziness and giddiness: Secondary | ICD-10-CM | POA: Diagnosis not present

## 2020-12-27 DIAGNOSIS — R7303 Prediabetes: Secondary | ICD-10-CM | POA: Diagnosis not present

## 2020-12-27 DIAGNOSIS — D509 Iron deficiency anemia, unspecified: Secondary | ICD-10-CM | POA: Diagnosis not present

## 2020-12-27 DIAGNOSIS — R102 Pelvic and perineal pain: Secondary | ICD-10-CM | POA: Diagnosis not present

## 2020-12-27 DIAGNOSIS — E282 Polycystic ovarian syndrome: Secondary | ICD-10-CM | POA: Diagnosis not present

## 2020-12-27 DIAGNOSIS — K5903 Drug induced constipation: Secondary | ICD-10-CM | POA: Diagnosis not present

## 2021-01-15 DIAGNOSIS — Z3480 Encounter for supervision of other normal pregnancy, unspecified trimester: Secondary | ICD-10-CM | POA: Diagnosis not present

## 2021-01-15 LAB — OB RESULTS CONSOLE GBS: GBS: POSITIVE

## 2021-03-21 ENCOUNTER — Encounter (HOSPITAL_COMMUNITY): Payer: Self-pay | Admitting: Obstetrics and Gynecology

## 2021-03-21 ENCOUNTER — Inpatient Hospital Stay (HOSPITAL_BASED_OUTPATIENT_CLINIC_OR_DEPARTMENT_OTHER): Payer: BC Managed Care – PPO

## 2021-03-21 ENCOUNTER — Other Ambulatory Visit: Payer: Self-pay

## 2021-03-21 ENCOUNTER — Inpatient Hospital Stay (HOSPITAL_COMMUNITY)
Admission: AD | Admit: 2021-03-21 | Discharge: 2021-03-21 | Disposition: A | Discharge status: Home / Self Care | Payer: BC Managed Care – PPO | Attending: Obstetrics and Gynecology | Admitting: Obstetrics and Gynecology

## 2021-03-21 DIAGNOSIS — O209 Hemorrhage in early pregnancy, unspecified: Secondary | ICD-10-CM | POA: Diagnosis not present

## 2021-03-21 DIAGNOSIS — O2342 Unspecified infection of urinary tract in pregnancy, second trimester: Secondary | ICD-10-CM

## 2021-03-21 DIAGNOSIS — N390 Urinary tract infection, site not specified: Secondary | ICD-10-CM | POA: Insufficient documentation

## 2021-03-21 DIAGNOSIS — O26892 Other specified pregnancy related conditions, second trimester: Secondary | ICD-10-CM | POA: Diagnosis not present

## 2021-03-21 DIAGNOSIS — O26852 Spotting complicating pregnancy, second trimester: Secondary | ICD-10-CM | POA: Diagnosis not present

## 2021-03-21 DIAGNOSIS — R109 Unspecified abdominal pain: Secondary | ICD-10-CM | POA: Diagnosis not present

## 2021-03-21 DIAGNOSIS — O4692 Antepartum hemorrhage, unspecified, second trimester: Secondary | ICD-10-CM

## 2021-03-21 DIAGNOSIS — Z3A15 15 weeks gestation of pregnancy: Secondary | ICD-10-CM

## 2021-03-21 LAB — WET PREP, GENITAL
Clue Cells Wet Prep HPF POC: NONE SEEN
Sperm: NONE SEEN
Trich, Wet Prep: NONE SEEN
WBC, Wet Prep HPF POC: >=10 — AB (ref <10)
Yeast Wet Prep HPF POC: NONE SEEN

## 2021-03-21 LAB — URINALYSIS, MICROSCOPIC (REFLEX)
RBC / HPF: >50 RBC/hpf (ref 0–5)
WBC, UA: >50 WBC/hpf (ref 0–5)

## 2021-03-21 LAB — URINALYSIS, ROUTINE W REFLEX MICROSCOPIC
Bilirubin Urine: NEGATIVE
Glucose, UA: NEGATIVE mg/dL
Ketones, ur: NEGATIVE mg/dL
Nitrite: POSITIVE — AB
Protein, ur: 100 mg/dL — AB
Specific Gravity, Urine: >1.03 — ABNORMAL HIGH (ref 1.005–1.030)
pH: 6 (ref 5.0–8.0)

## 2021-03-21 MED ORDER — CEFADROXIL 500 MG PO CAPS: 500.0000 mg | ORAL_CAPSULE | Freq: Two times a day (BID) | ORAL | 0 refills | Status: DC

## 2021-03-21 NOTE — Discharge Instructions (Addendum)
Return to MAU: If you have heavier bleeding that soaks through more that 2 pads per hour for an hour or more If you bleed so much that you feel like you might pass out or you do pass out If you have significant abdominal pain that is not improved with Tylenol 1000 mg every 8 hours as needed for pain If you develop a fever > 100.5    [employer] for [employer] at [employer] [facility]             [address] [place], [place] [address] [phone]  Center for [employer] at [facility]                                                             [address], [place], [place], [address] [phone]  Center for [facility] [address], [place], [place] [address] [phone]  Center for [facility] at [facility] [address], [address], [place], [place], [address] [phone]  Center for [facility] at [facility] [address], [place], [place], [address] [phone]  Center for [facility] at [facility]                                 [address] [place], [place], [place], [address] [phone]  Center for [facility] at [facility]                                    [address], [place], [place], [address] [phone]  Center for [facility] at [facility] [address], [address], [place], [place], [address]                              [facility] [address], [place], [place], [address] [phone]  [facility]         Phone: [phone]  [facility]      Phone: [phone]   [facility]      Phone: [phone]  [facility]         Phone: [phone]   [facility]    Phone: [phone]  [facility]      Phone: [phone]  [facility]     Phone: [phone]  [facility]        Phone: [phone]  [facility]      Phone: [phone]

## 2021-03-21 NOTE — MAU Note (Signed)
Felt "like something was poking her down there" when she went to bad last night.  Around 1130, noted blood when she wiped.  Had frequency of urination and pain associated. Still seeing blood every time she wipes. Having cramping in lower abd and upper legs. No placental issues on [place].

## 2021-03-21 NOTE — MAU Provider Note (Signed)
History     CSN: [id number]  Arrival date and time: 03/21/21 8563   Event Date/Time   First Provider Initiated Contact with Patient 03/21/21 0815      Chief Complaint  Patient presents with   Vaginal Bleeding   Dysuria   Kathryn Cruz is a 30 y.o. year old [id number] female at [redacted]w[redacted]d weeks gestation who presents to MAU reporting she felt a "really bad" pain in her LT mid back while driving home from work yesterday about 1700. She reports that around 1130 last night she started having urinary frequency, pain with urination, blood with wiping, cramping in lower abdomen, and upper legs. She reports that she has not been able to sleep since 1130 last night, because of the frequent urination. Her back pain resolved after about 2 minutes, so she did not think it was anything. She reports being told she has (+) GBS from urine culture at beginning of pregnancy and will receive abx for that while in labor. She receives [facility] with [facility]; last appt was last week. Her spouse is present and contributing to the history taking.   OB History     Gravida  3   Para  2   Term  2   Preterm      AB      Living  2      SAB      IAB      Ectopic      Multiple  0   Live Births  2           Past Medical History:  Diagnosis Date   Gestational diabetes    diet controlled, glyburide   Headache    History of chlamydia    Infection    uti    Past Surgical History:  Procedure Laterality Date   NO PAST SURGERIES     WISDOM TOOTH EXTRACTION      Family History  Problem Relation Age of Onset   Hypertension Mother    Diabetes Mother    Hyperlipidemia Mother    Hypertension Maternal Grandmother    Cancer Paternal Grandmother        throat    Social History   Tobacco Use   Smoking status: Never   Smokeless tobacco: Never  Substance Use Topics   Alcohol use: No   Drug use: No    Allergies: No Known Allergies  Medications Prior to Admission  Medication  Sig Dispense Refill Last Dose   acetaminophen (TYLENOL) 500 MG tablet Take 1,000 mg by mouth every 6 (six) hours as needed for mild pain or moderate pain. For pain        Review of Systems  Constitutional: Negative.   HENT: Negative.    Eyes: Negative.   Respiratory: Negative.    Cardiovascular: Negative.   Gastrointestinal: Negative.   Endocrine: Negative.   Genitourinary:  Positive for pelvic pain (cramping) and vaginal bleeding (spotting).  Musculoskeletal:  Positive for back pain.  Skin: Negative.   Allergic/Immunologic: Negative.   Neurological: Negative.   Hematological: Negative.   Psychiatric/Behavioral: Negative.    Physical Exam   Blood pressure 127/63, pulse 81, temperature 98.2 F (36.8 C), temperature source Oral, resp. rate 18, height 5' (1.524 m), weight 69.9 kg, SpO2 100 %, unknown if currently breastfeeding.  Physical Exam Vitals and nursing note reviewed. Exam conducted with a chaperone present.  Constitutional:      Appearance: Normal appearance. She is normal weight.  Pulmonary:  Effort: Pulmonary effort is normal.  Abdominal:     General: Abdomen is flat.     Palpations: Abdomen is soft.     Tenderness: There is abdominal tenderness (lower pelvis on RT and LT; R>L).  Genitourinary:    General: Normal vulva.     Comments: Pelvic exam: External genitalia normal, SE: vaginal walls pink and well rugated, cervix is smooth, pink, no lesions, small amt of thin, white vaginal d/c -- WP, GC/CT done, cervix visually closed, Uterus is non-tender, S=D, no CMT or friability, no adnexal tenderness.  Skin:    General: Skin is warm and dry.  Neurological:     Mental Status: She is alert and oriented to person, place, and time.  Psychiatric:        Mood and Affect: Mood normal.        Behavior: Behavior normal.        Thought Content: Thought content normal.        Judgment: Judgment normal.   FHTs by doppler: 152 bpm  MAU Course   Procedures  MDM CCUA UCx--Results pending  Wet Prep GC/CT--Results pending OB MFM Limited U/S  Results for orders placed or performed during the hospital encounter of 03/21/21 (from the past 24 hour(s))  Urinalysis, Routine w reflex microscopic Urine, Clean Catch     Status: Abnormal   Collection Time: 03/21/21  7:24 AM  Result Value Ref Range   Color, Urine YELLOW YELLOW   APPearance CLEAR CLEAR   Specific Gravity, Urine >1.030 (H) 1.005 - 1.030   pH 6.0 5.0 - 8.0   Glucose, UA NEGATIVE NEGATIVE mg/dL   Hgb urine dipstick LARGE (A) NEGATIVE   Bilirubin Urine NEGATIVE NEGATIVE   Ketones, ur NEGATIVE NEGATIVE mg/dL   Protein, ur 338 (A) NEGATIVE mg/dL   Nitrite POSITIVE (A) NEGATIVE   Leukocytes,Ua SMALL (A) NEGATIVE  Urinalysis, Microscopic (reflex)     Status: Abnormal   Collection Time: 03/21/21  7:24 AM  Result Value Ref Range   RBC / HPF >50 0 - 5 RBC/hpf   WBC, UA >50 0 - 5 WBC/hpf   Bacteria, UA MANY (A) NONE SEEN   Squamous Epithelial / LPF 6-10 0 - 5   WBC Clumps PRESENT    Mucus PRESENT   Wet prep, genital     Status: Abnormal   Collection Time: 03/21/21  8:27 AM  Result Value Ref Range   Yeast Wet Prep HPF POC NONE SEEN NONE SEEN   Trich, Wet Prep NONE SEEN NONE SEEN   Clue Cells Wet Prep HPF POC NONE SEEN NONE SEEN   WBC, Wet Prep HPF POC >=10 (A) <10   Sperm NONE SEEN        Assessment and Plan  Vaginal bleeding in pregnancy, second trimester  - Information provided on VB in pregnancy - Return to MAU: If you have heavier bleeding that soaks through more that 2 pads per hour for an hour or more If you bleed so much that you feel like you might pass out or you do pass out If you have significant abdominal pain that is not improved with Tylenol 1000 mg every 8 hours as needed for pain If you develop a fever > 100.5    Abdominal pain during pregnancy in second trimester - Information provided on abdominal pain in pregnancy - Advised to take Tylenol prn  pain   [redacted] weeks gestation of pregnancy  UTI (urinary tract infection) during pregnancy, second trimester -  - Information  provided on UTI   - Discharge patient  - Keep scheduled appt with [facility] - Patient verbalized an understanding of the plan of care and agrees.    Raelyn Mora, CNM 03/21/2021, 8:15 AM

## 2021-03-23 LAB — CULTURE, OB URINE: Culture: >=100000 — AB

## 2021-03-24 ENCOUNTER — Other Ambulatory Visit: Payer: Self-pay | Admitting: Obstetrics and Gynecology

## 2021-03-24 ENCOUNTER — Telehealth: Payer: Self-pay | Admitting: Obstetrics and Gynecology

## 2021-03-24 DIAGNOSIS — O2342 Unspecified infection of urinary tract in pregnancy, second trimester: Secondary | ICD-10-CM

## 2021-03-24 LAB — GC/CHLAMYDIA PROBE AMP (~~LOC~~) NOT AT ARMC
Chlamydia: NEGATIVE
Comment: NEGATIVE
Comment: NORMAL
Neisseria Gonorrhea: NEGATIVE

## 2021-03-24 MED ORDER — AMOXICILLIN-POT CLAVULANATE 875-125 MG PO TABS: 1.0000 | ORAL_TABLET | Freq: Two times a day (BID) | ORAL | 0 refills | Status: DC

## 2021-03-24 NOTE — Telephone Encounter (Signed)
*  Consult with Dr. Alysia Penna @ 1745 - notified of patient's lab results, recommended tx plan change Rx to Augmentin BID x 7-10 days   Identity verified by DOB and last 4 digits of SSN  TC to notify patient of UCs results and the need to change abx from Cefadroxil to Augmentin. Pharmacy verified. Patient verbalized an understanding of the plan of care and agrees.   Raelyn Mora, CNM  03/24/2021 6:01 PM

## 2021-03-31 ENCOUNTER — Inpatient Hospital Stay (HOSPITAL_COMMUNITY)
Admission: AD | Admit: 2021-03-31 | Discharge: 2021-03-31 | Disposition: A | Discharge status: Home / Self Care | Payer: BC Managed Care – PPO | Attending: Obstetrics and Gynecology | Admitting: Obstetrics and Gynecology

## 2021-03-31 ENCOUNTER — Encounter (HOSPITAL_COMMUNITY): Payer: Self-pay | Admitting: Obstetrics and Gynecology

## 2021-03-31 ENCOUNTER — Other Ambulatory Visit: Payer: Self-pay

## 2021-03-31 DIAGNOSIS — M549 Dorsalgia, unspecified: Secondary | ICD-10-CM | POA: Diagnosis not present

## 2021-03-31 DIAGNOSIS — B3731 Acute candidiasis of vulva and vagina: Secondary | ICD-10-CM | POA: Insufficient documentation

## 2021-03-31 DIAGNOSIS — N390 Urinary tract infection, site not specified: Secondary | ICD-10-CM | POA: Insufficient documentation

## 2021-03-31 DIAGNOSIS — O2342 Unspecified infection of urinary tract in pregnancy, second trimester: Secondary | ICD-10-CM | POA: Insufficient documentation

## 2021-03-31 DIAGNOSIS — B9689 Other specified bacterial agents as the cause of diseases classified elsewhere: Secondary | ICD-10-CM | POA: Insufficient documentation

## 2021-03-31 DIAGNOSIS — Z3A16 16 weeks gestation of pregnancy: Secondary | ICD-10-CM | POA: Insufficient documentation

## 2021-03-31 DIAGNOSIS — M79604 Pain in right leg: Secondary | ICD-10-CM | POA: Diagnosis not present

## 2021-03-31 DIAGNOSIS — B9620 Unspecified Escherichia coli [E. coli] as the cause of diseases classified elsewhere: Secondary | ICD-10-CM | POA: Insufficient documentation

## 2021-03-31 DIAGNOSIS — O23592 Infection of other part of genital tract in pregnancy, second trimester: Secondary | ICD-10-CM | POA: Diagnosis not present

## 2021-03-31 DIAGNOSIS — B9629 Other Escherichia coli [E. coli] as the cause of diseases classified elsewhere: Secondary | ICD-10-CM

## 2021-03-31 DIAGNOSIS — M79602 Pain in left arm: Secondary | ICD-10-CM | POA: Insufficient documentation

## 2021-03-31 DIAGNOSIS — M79605 Pain in left leg: Secondary | ICD-10-CM | POA: Insufficient documentation

## 2021-03-31 DIAGNOSIS — M79601 Pain in right arm: Secondary | ICD-10-CM | POA: Diagnosis not present

## 2021-03-31 DIAGNOSIS — Z1612 Extended spectrum beta lactamase (ESBL) resistance: Secondary | ICD-10-CM

## 2021-03-31 LAB — URINALYSIS, ROUTINE W REFLEX MICROSCOPIC
Bilirubin Urine: NEGATIVE
Glucose, UA: NEGATIVE mg/dL
Hgb urine dipstick: NEGATIVE
Ketones, ur: NEGATIVE mg/dL
Nitrite: NEGATIVE
Protein, ur: NEGATIVE mg/dL
Specific Gravity, Urine: >1.03 — ABNORMAL HIGH (ref 1.005–1.030)
pH: 6 (ref 5.0–8.0)

## 2021-03-31 LAB — WET PREP, GENITAL
Clue Cells Wet Prep HPF POC: NONE SEEN
Sperm: NONE SEEN
Trich, Wet Prep: NONE SEEN
WBC, Wet Prep HPF POC: >=10 — AB (ref <10)

## 2021-03-31 LAB — CBC
HCT: 34.4 % — ABNORMAL LOW (ref 36.0–46.0)
Hemoglobin: 11.1 g/dL — ABNORMAL LOW (ref 12.0–15.0)
MCH: 25.1 pg — ABNORMAL LOW (ref 26.0–34.0)
MCHC: 32.3 g/dL (ref 30.0–36.0)
MCV: 77.7 fL — ABNORMAL LOW (ref 80.0–100.0)
Platelets: 300 10*3/uL (ref 150–400)
RBC: 4.43 MIL/uL (ref 3.87–5.11)
RDW: 14 % (ref 11.5–15.5)
WBC: 8.5 10*3/uL (ref 4.0–10.5)
nRBC: 0 % (ref 0.0–0.2)

## 2021-03-31 LAB — URINALYSIS, MICROSCOPIC (REFLEX)

## 2021-03-31 MED ORDER — TERCONAZOLE 0.4 % VA CREA: 1.0000 | TOPICAL_CREAM | Freq: Every day | VAGINAL | 1 refills | Status: DC

## 2021-03-31 MED ORDER — TERCONAZOLE 0.4 % VA CREA: 1.0000 | TOPICAL_CREAM | Freq: Every day | VAGINAL | 0 refills | Status: DC

## 2021-03-31 MED ORDER — FOSFOMYCIN TROMETHAMINE 3 G PO PACK
3.0000 g | PACK | Freq: Once | ORAL | Status: AC
  Administered 2021-03-31: 3 g via ORAL
  Filled 2021-03-31: qty 3

## 2021-03-31 NOTE — MAU Provider Note (Signed)
Patient Kathryn Cruz is a 30 y.o. G3P2002  At [redacted]w[redacted]d here with complaints of vaginal itching that started about 4 days ago and continuous burning when she urinates. She has occasional spotting (has already been seen in MAU for this). She denies any diabetes or high blood pressure. She got scared because her back was starting to hurt.   She fever, SOB. She reports that her "legs ache for the past 4 days". Her arms and her legs still hurt.  History     CSN: [id number]  Arrival date and time: 03/31/21 1214   Event Date/Time   First Provider Initiated Contact with Patient 03/31/21 1259      Chief Complaint  Patient presents with   Dysuria   Dysuria  This is a new problem. The current episode started in the past 7 days. The problem has been unchanged. The pain is at a severity of 7/10. There has been no fever. Pertinent negatives include no chills or nausea.  Vaginal Pain The patient's primary symptoms include genital itching. The patient's pertinent negatives include no vaginal bleeding. This is a new problem. The current episode started in the past 7 days. The problem occurs constantly. Associated symptoms include back pain and dysuria. Pertinent negatives include no chills, constipation or nausea.   OB History     Gravida  3   Para  2   Term  2   Preterm      AB      Living  2      SAB      IAB      Ectopic      Multiple  0   Live Births  2           Past Medical History:  Diagnosis Date   Gestational diabetes    diet controlled, glyburide   Headache    History of chlamydia    Infection    uti    Past Surgical History:  Procedure Laterality Date   NO PAST SURGERIES     WISDOM TOOTH EXTRACTION      Family History  Problem Relation Age of Onset   Hypertension Mother    Diabetes Mother    Hyperlipidemia Mother    Hypertension Maternal Grandmother    Cancer Paternal Grandmother        throat    Social History   Tobacco Use    Smoking status: Never   Smokeless tobacco: Never  Substance Use Topics   Alcohol use: No   Drug use: No    Allergies: No Known Allergies  Medications Prior to Admission  Medication Sig Dispense Refill Last Dose   acetaminophen (TYLENOL) 500 MG tablet Take 1,000 mg by mouth every 6 (six) hours as needed for mild pain or moderate pain. For pain       amoxicillin-clavulanate (AUGMENTIN) 875-125 MG tablet Take 1 tablet by mouth 2 (two) times daily. 20 tablet 0    cefadroxil (DURICEF) 500 MG capsule Take 1 capsule (500 mg total) by mouth 2 (two) times daily for 10 days. 20 capsule 0     Review of Systems  Constitutional:  Negative for chills.  Gastrointestinal:  Negative for constipation and nausea.  Genitourinary:  Positive for dysuria and vaginal pain.  Musculoskeletal:  Positive for back pain.  Physical Exam   Blood pressure (!) 115/59, pulse 86, temperature 98.1 F (36.7 C), temperature source Oral, resp. rate 16, height 5' (1.524 m), weight 69.7 kg, SpO2 98 %, unknown if  currently breastfeeding.  Physical Exam Constitutional:      Appearance: Normal appearance.  Cardiovascular:     Rate and Rhythm: Normal rate.  Pulmonary:     Effort: Pulmonary effort is normal.  Abdominal:     General: Abdomen is flat.  Musculoskeletal:        General: Normal range of motion.     Cervical back: Normal range of motion.     Comments: Neg CVA tenderness  Skin:    General: Skin is warm.  Neurological:     Mental Status: She is alert.    MAU Course  Procedures  MDM -UA today shows no nitrites or WBS, however, patient continuing to report dysuria. No blood urine, occasional spotting when she wipes -FHR was 146 -discussed with Dr. Crissie Reese, [facility] and ID pharmacists; given that patient's urine culture was only sensitivie to augmentin and she is still having symptoms 5 days later, will treat with one dose of fosfomycin. She does not have systemtic complaints, no fever, no CVA tender,  White count is 8.  -patient had fosfomycin 3 grams in MAU --speculum exam deferred, patient self-swabbed. wet prep show yeast as well, most likely due to antibiotic use   Assessment and Plan   1. Urinary tract infection due to extended-spectrum beta lactamase (ESBL) producing Escherichia coli   2. Yeast infection of the vagina    -patient had fosfomycin in MAU; tolerated  well -RX for terazol given, long discussion with patient about how to use applicators  -reviewed warning signs and symptoms of worsening dysuria/signs of systemic infection. Patient knows that she will most likely have to be admitted for IV antibiotics if infection does not clear up -patient to keep follow up visit with OB on 12/19 and plan for UTI in 4 weeks -all questions answered   Charlesetta Garibaldi Geonna Lockyer 03/31/2021, 1:08 PM

## 2021-03-31 NOTE — MAU Note (Signed)
Kathryn Cruz is a 30 y.o. at [redacted]w[redacted]d here in MAU reporting: was dx with UTI on 12/2. A couple days later her abx were changed and was told if didn't go away then she would need to come back for IV treatment. States she is still having the burning when she pees and is feeling irritated and itching. Still having urgency and frequency. Having intermittent back pain and abdominal cramping, no pain at this time. Seeing some spotting. No abnormal discharge.  Onset of complaint: ongoing  Pain score: 0/10  Vitals:   03/31/21 1228  BP: 119/62  Pulse: 80  Resp: 16  Temp: 98.1 F (36.7 C)  SpO2: 98%     FHT:148  Lab orders placed from triage: UA

## 2021-03-31 NOTE — Discharge Instructions (Signed)
-  tell ob about your UTI in early December, they will want to test you in a few weeks to make sure the infection is gone

## 2021-04-01 LAB — GC/CHLAMYDIA PROBE AMP (~~LOC~~) NOT AT ARMC
Chlamydia: NEGATIVE
Comment: NEGATIVE
Comment: NORMAL
Neisseria Gonorrhea: NEGATIVE

## 2021-04-17 DIAGNOSIS — R7303 Prediabetes: Secondary | ICD-10-CM | POA: Diagnosis not present

## 2021-04-17 DIAGNOSIS — N390 Urinary tract infection, site not specified: Secondary | ICD-10-CM | POA: Diagnosis not present

## 2021-04-28 DIAGNOSIS — E663 Overweight: Secondary | ICD-10-CM | POA: Diagnosis not present

## 2021-04-28 DIAGNOSIS — R7303 Prediabetes: Secondary | ICD-10-CM | POA: Diagnosis not present

## 2021-04-28 DIAGNOSIS — E282 Polycystic ovarian syndrome: Secondary | ICD-10-CM | POA: Diagnosis not present

## 2021-04-28 DIAGNOSIS — E782 Mixed hyperlipidemia: Secondary | ICD-10-CM | POA: Diagnosis not present

## 2021-04-28 DIAGNOSIS — J40 Bronchitis, not specified as acute or chronic: Secondary | ICD-10-CM | POA: Diagnosis not present

## 2021-04-28 DIAGNOSIS — F419 Anxiety disorder, unspecified: Secondary | ICD-10-CM | POA: Diagnosis not present

## 2021-04-28 DIAGNOSIS — N921 Excessive and frequent menstruation with irregular cycle: Secondary | ICD-10-CM | POA: Diagnosis not present

## 2021-04-28 DIAGNOSIS — K219 Gastro-esophageal reflux disease without esophagitis: Secondary | ICD-10-CM | POA: Diagnosis not present

## 2021-04-28 DIAGNOSIS — R42 Dizziness and giddiness: Secondary | ICD-10-CM | POA: Diagnosis not present

## 2021-05-15 DIAGNOSIS — N898 Other specified noninflammatory disorders of vagina: Secondary | ICD-10-CM | POA: Diagnosis not present

## 2021-06-13 DIAGNOSIS — D649 Anemia, unspecified: Secondary | ICD-10-CM | POA: Diagnosis not present

## 2021-07-01 ENCOUNTER — Other Ambulatory Visit: Payer: Self-pay

## 2021-07-01 ENCOUNTER — Inpatient Hospital Stay (HOSPITAL_COMMUNITY)
Admission: AD | Admit: 2021-07-01 | Discharge: 2021-07-01 | Disposition: A | Discharge status: Home / Self Care | Payer: BC Managed Care – PPO | Attending: Obstetrics and Gynecology | Admitting: Obstetrics and Gynecology

## 2021-07-01 ENCOUNTER — Encounter (HOSPITAL_COMMUNITY): Payer: Self-pay | Admitting: Obstetrics and Gynecology

## 2021-07-01 DIAGNOSIS — M545 Low back pain, unspecified: Secondary | ICD-10-CM | POA: Insufficient documentation

## 2021-07-01 DIAGNOSIS — Z3A29 29 weeks gestation of pregnancy: Secondary | ICD-10-CM | POA: Diagnosis not present

## 2021-07-01 DIAGNOSIS — R109 Unspecified abdominal pain: Secondary | ICD-10-CM

## 2021-07-01 DIAGNOSIS — O26899 Other specified pregnancy related conditions, unspecified trimester: Secondary | ICD-10-CM

## 2021-07-01 DIAGNOSIS — O26893 Other specified pregnancy related conditions, third trimester: Secondary | ICD-10-CM | POA: Diagnosis present

## 2021-07-01 DIAGNOSIS — R102 Pelvic and perineal pain: Secondary | ICD-10-CM | POA: Insufficient documentation

## 2021-07-01 DIAGNOSIS — O99891 Other specified diseases and conditions complicating pregnancy: Secondary | ICD-10-CM

## 2021-07-01 DIAGNOSIS — Z3689 Encounter for other specified antenatal screening: Secondary | ICD-10-CM | POA: Diagnosis not present

## 2021-07-01 LAB — AMNISURE RUPTURE OF MEMBRANE (ROM) NOT AT ARMC: Amnisure ROM: NEGATIVE

## 2021-07-01 LAB — URINALYSIS, ROUTINE W REFLEX MICROSCOPIC
Bilirubin Urine: NEGATIVE
Glucose, UA: 50 mg/dL — AB
Hgb urine dipstick: NEGATIVE
Ketones, ur: 5 mg/dL — AB
Nitrite: NEGATIVE
Protein, ur: NEGATIVE mg/dL
Specific Gravity, Urine: 1.027 (ref 1.005–1.030)
pH: 5 (ref 5.0–8.0)

## 2021-07-01 LAB — WET PREP, GENITAL
Clue Cells Wet Prep HPF POC: NONE SEEN
Sperm: NONE SEEN
Trich, Wet Prep: NONE SEEN
WBC, Wet Prep HPF POC: <10 (ref <10)
Yeast Wet Prep HPF POC: NONE SEEN

## 2021-07-01 MED ORDER — CYCLOBENZAPRINE HCL 10 MG PO TABS
10.0000 mg | ORAL_TABLET | Freq: Three times a day (TID) | ORAL | 0 refills | Status: AC | PRN
Start: 2021-07-01 — End: ?

## 2021-07-01 NOTE — Progress Notes (Signed)
Written and verbal d/c instructions given and understanding voiced. 

## 2021-07-01 NOTE — MAU Note (Addendum)
.  Kathryn Cruz is a 31 y.o. at [redacted]w[redacted]d here in MAU reporting: lower abdominal cramping that started x2 days ago. She also states she had a gush of clear watery fluid x2 days ago (states unsure it was) but currently denies abnormal discharge. Last IC x3 days ago. No VB. +FM. Currently having pelvic pressure, denies urinary s/sx.  ? ?Pain score: 10 ? ?Lab orders placed from triage:  UA ?

## 2021-07-01 NOTE — MAU Provider Note (Signed)
?History  ?  ? ?CSN: [id number] ? ?Arrival date and time: 07/01/21 1732 ? ? Event Date/Time  ? First Provider Initiated Contact with Patient 07/01/21 1807   ?  ? ?Chief Complaint  ?Patient presents with  ? Abdominal Pain  ? Pelvic Pain  ? ?31 y.o. G3P2002 @29 .5 wks presenting with LAP, LBP, and pelvic pressure. Sx started 2 days ago. Pain is consistent with ctx at times and other times feels like cramping. Rates pain 10/10. Denies VB but reports leaking clear fluid a few days ago, no leaking since. Denies vaginal itching or irritation. No recent IC. Denies urinary sx. Reports bilateral LBP. Rates 8/10. Denies lifting or recent injury. Reports +FM ? ? ?OB History   ? ? Gravida  ?3  ? Para  ?2  ? Term  ?2  ? Preterm  ?   ? AB  ?   ? Living  ?2  ?  ? ? SAB  ?   ? IAB  ?   ? Ectopic  ?   ? Multiple  ?0  ? Live Births  ?2  ?   ?  ?  ? ? ?Past Medical History:  ?Diagnosis Date  ? Gestational diabetes   ? diet controlled, glyburide  ? Headache   ? History of chlamydia   ? Infection   ? uti  ? ? ?Past Surgical History:  ?Procedure Laterality Date  ? NO PAST SURGERIES    ? WISDOM TOOTH EXTRACTION    ? ? ?Family History  ?Problem Relation Age of Onset  ? Hypertension Mother   ? Diabetes Mother   ? Hyperlipidemia Mother   ? Hypertension Maternal Grandmother   ? Cancer Paternal Grandmother   ?     throat  ? ? ?Social History  ? ?Tobacco Use  ? Smoking status: Never  ? Smokeless tobacco: Never  ?Substance Use Topics  ? Alcohol use: No  ? Drug use: No  ? ? ?Allergies: No Known Allergies ? ?Medications Prior to Admission  ?Medication Sig Dispense Refill Last Dose  ? Prenatal Vit-Fe Fumarate-FA (PRENATAL MULTIVITAMIN) TABS tablet Take 1 tablet by mouth daily at 12 noon.   07/01/2021  ? acetaminophen (TYLENOL) 500 MG tablet Take 1,000 mg by mouth every 6 (six) hours as needed for mild pain or moderate pain. For pain      ? terconazole (TERAZOL 7) 0.4 % vaginal cream Place 1 applicator vaginally at bedtime. 45 g 1   ? ? ?Review of  Systems  ?Constitutional:  Negative for chills and fever.  ?Gastrointestinal:  Positive for abdominal pain. Negative for constipation, diarrhea, nausea and vomiting.  ?Genitourinary:  Positive for vaginal bleeding. Negative for dysuria, frequency, hematuria, urgency and vaginal discharge.  ?Musculoskeletal:  Positive for back pain.  ?Physical Exam  ? ?Blood pressure 120/72, pulse (!) 101, temperature 97.7 ?F (36.5 ?C), temperature source Oral, resp. rate 17, SpO2 97 %, unknown if currently breastfeeding. ? ?Physical Exam ?Vitals and nursing note reviewed.  ?Constitutional:   ?   General: She is not in acute distress. ?   Appearance: Normal appearance.  ?HENT:  ?   Head: Normocephalic and atraumatic.  ?Cardiovascular:  ?   Rate and Rhythm: Normal rate.  ?Pulmonary:  ?   Effort: Pulmonary effort is normal. No respiratory distress.  ?Abdominal:  ?   Palpations: Abdomen is soft.  ?   Tenderness: There is no abdominal tenderness.  ?Genitourinary: ?   Comments: VE: closed/thick ?Musculoskeletal:     ?  General: Normal range of motion.  ?   Cervical back: Normal range of motion.  ?Skin: ?   General: Skin is warm and dry.  ?Neurological:  ?   General: No focal deficit present.  ?   Mental Status: She is alert and oriented to person, place, and time.  ?Psychiatric:     ?   Mood and Affect: Mood normal.     ?   Behavior: Behavior normal.  ?EFM: 140 bpm, mod variability, + accels, no decels ?Toco: UI ? ?Results for orders placed or performed during the hospital encounter of 07/01/21 (from the past 24 hour(s))  ?Urinalysis, Routine w reflex microscopic Urine, Clean Catch     Status: Abnormal  ? Collection Time: 07/01/21  5:59 PM  ?Result Value Ref Range  ? Color, Urine YELLOW YELLOW  ? APPearance CLOUDY (A) CLEAR  ? Specific Gravity, Urine 1.027 1.005 - 1.030  ? pH 5.0 5.0 - 8.0  ? Glucose, UA 50 (A) NEGATIVE mg/dL  ? Hgb urine dipstick NEGATIVE NEGATIVE  ? Bilirubin Urine NEGATIVE NEGATIVE  ? Ketones, ur 5 (A) NEGATIVE mg/dL   ? Protein, ur NEGATIVE NEGATIVE mg/dL  ? Nitrite NEGATIVE NEGATIVE  ? Leukocytes,Ua SMALL (A) NEGATIVE  ? RBC / HPF 0-5 0 - 5 RBC/hpf  ? WBC, UA 0-5 0 - 5 WBC/hpf  ? Bacteria, UA FEW (A) NONE SEEN  ? Squamous Epithelial / LPF 21-50 0 - 5  ? Mucus PRESENT   ?Wet prep, genital     Status: None  ? Collection Time: 07/01/21  6:22 PM  ? Specimen: Vaginal  ?Result Value Ref Range  ? Yeast Wet Prep HPF POC NONE SEEN NONE SEEN  ? Trich, Wet Prep NONE SEEN NONE SEEN  ? Clue Cells Wet Prep HPF POC NONE SEEN NONE SEEN  ? WBC, Wet Prep HPF POC <10 <10  ? Sperm NONE SEEN   ?Amnisure rupture of membrane (rom)not at [facility]     Status: None  ? Collection Time: 07/01/21  6:22 PM  ?Result Value Ref Range  ? Amnisure ROM NEGATIVE   ? ?MAU Course  ?Procedures ? ?MDM ?Prenatal records reviewed: pregnancy complicated by fetal pyelectasis, A1GDM, hx PCOS, UTI, +GBS.  ?Offered analgesic but pt declines. Labs ordered and reviewed. No signs of PTL or UTI. Urine culture ordered. Pt is stable for discharge. ? ?Assessment and Plan  ? ?1. [redacted] weeks gestation of pregnancy   ?2. NST (non-stress test) reactive   ?3. Abdominal cramping affecting pregnancy   ?4. Back pain affecting pregnancy in third trimester   ? ?Discharge home ?[facility] as [facility] ?Rx Flexeril ?PTL precautions ? ?Allergies as of 07/01/2021   ?No Known Allergies ?  ? ?  ?Medication List  ?  ? ?STOP taking these medications   ? ?terconazole 0.4 % vaginal cream ?Commonly known as: TERAZOL 7 ?  ? ?  ? ?TAKE these medications   ? ?acetaminophen 500 MG tablet ?Commonly known as: TYLENOL ?Take 1,000 mg by mouth every 6 (six) hours as needed for mild pain or moderate pain. For pain ?  ?cyclobenzaprine 10 MG tablet ?Commonly known as: FLEXERIL ?Take 1 tablet (10 mg total) by mouth 3 (three) times daily as needed (back pain). ?  ?prenatal multivitamin Tabs tablet ?Take 1 tablet by mouth daily at 12 noon. ?  ? ?  ? ? ? ?Julianne Handler, CNM ?07/01/2021, 6:22 PM  ?

## 2021-07-02 LAB — GC/CHLAMYDIA PROBE AMP (~~LOC~~) NOT AT ARMC
Chlamydia: NEGATIVE
Comment: NEGATIVE
Comment: NORMAL
Neisseria Gonorrhea: NEGATIVE

## 2021-07-03 LAB — CULTURE, OB URINE: Culture: 70000 — AB

## 2021-07-14 ENCOUNTER — Encounter (HOSPITAL_COMMUNITY): Payer: Self-pay | Admitting: Obstetrics and Gynecology

## 2021-07-14 ENCOUNTER — Inpatient Hospital Stay (HOSPITAL_COMMUNITY)
Admission: AD | Admit: 2021-07-14 | Discharge: 2021-07-27 | DRG: 786 | Disposition: A | Discharge status: Home / Self Care | Payer: BC Managed Care – PPO | Attending: Obstetrics and Gynecology | Admitting: Obstetrics and Gynecology

## 2021-07-14 ENCOUNTER — Other Ambulatory Visit: Payer: Self-pay

## 2021-07-14 ENCOUNTER — Inpatient Hospital Stay (HOSPITAL_BASED_OUTPATIENT_CLINIC_OR_DEPARTMENT_OTHER): Payer: BC Managed Care – PPO

## 2021-07-14 DIAGNOSIS — Z228 Carrier of other infectious diseases: Secondary | ICD-10-CM

## 2021-07-14 DIAGNOSIS — O42913 Preterm premature rupture of membranes, unspecified as to length of time between rupture and onset of labor, third trimester: Principal | ICD-10-CM | POA: Diagnosis present

## 2021-07-14 DIAGNOSIS — O4290 Premature rupture of membranes, unspecified as to length of time between rupture and onset of labor, unspecified weeks of gestation: Secondary | ICD-10-CM | POA: Diagnosis not present

## 2021-07-14 DIAGNOSIS — O24420 Gestational diabetes mellitus in childbirth, diet controlled: Secondary | ICD-10-CM | POA: Diagnosis not present

## 2021-07-14 DIAGNOSIS — O99824 Streptococcus B carrier state complicating childbirth: Secondary | ICD-10-CM | POA: Diagnosis not present

## 2021-07-14 DIAGNOSIS — Z3A31 31 weeks gestation of pregnancy: Secondary | ICD-10-CM

## 2021-07-14 DIAGNOSIS — O9902 Anemia complicating childbirth: Secondary | ICD-10-CM | POA: Diagnosis not present

## 2021-07-14 DIAGNOSIS — D509 Iron deficiency anemia, unspecified: Secondary | ICD-10-CM | POA: Diagnosis present

## 2021-07-14 DIAGNOSIS — Z3A32 32 weeks gestation of pregnancy: Secondary | ICD-10-CM | POA: Diagnosis not present

## 2021-07-14 DIAGNOSIS — O328XX0 Maternal care for other malpresentation of fetus, not applicable or unspecified: Secondary | ICD-10-CM | POA: Diagnosis not present

## 2021-07-14 DIAGNOSIS — O42919 Preterm premature rupture of membranes, unspecified as to length of time between rupture and onset of labor, unspecified trimester: Principal | ICD-10-CM

## 2021-07-14 DIAGNOSIS — Z2239 Carrier of other specified bacterial diseases: Secondary | ICD-10-CM

## 2021-07-14 DIAGNOSIS — A499 Bacterial infection, unspecified: Secondary | ICD-10-CM

## 2021-07-14 DIAGNOSIS — Z98891 History of uterine scar from previous surgery: Secondary | ICD-10-CM

## 2021-07-14 HISTORY — DX: Bacterial infection, unspecified: A49.9

## 2021-07-14 LAB — RPR: RPR Ser Ql: NONREACTIVE

## 2021-07-14 LAB — HIV ANTIBODY (ROUTINE TESTING W REFLEX): HIV Screen 4th Generation wRfx: NONREACTIVE

## 2021-07-14 LAB — GLUCOSE, CAPILLARY
Glucose-Capillary: 91 mg/dL (ref 70–99)
Glucose-Capillary: 210 mg/dL — ABNORMAL HIGH (ref 70–99)
Glucose-Capillary: 88 mg/dL (ref 70–99)
Glucose-Capillary: 144 mg/dL — ABNORMAL HIGH (ref 70–99)

## 2021-07-14 LAB — CBC
HCT: 32.7 % — ABNORMAL LOW (ref 36.0–46.0)
Hemoglobin: 10 g/dL — ABNORMAL LOW (ref 12.0–15.0)
MCH: 23.9 pg — ABNORMAL LOW (ref 26.0–34.0)
MCHC: 30.6 g/dL (ref 30.0–36.0)
MCV: 78 fL — ABNORMAL LOW (ref 80.0–100.0)
Platelets: 302 10*3/uL (ref 150–400)
RBC: 4.19 MIL/uL (ref 3.87–5.11)
RDW: 13.7 % (ref 11.5–15.5)
WBC: 9.8 10*3/uL (ref 4.0–10.5)
nRBC: 0 % (ref 0.0–0.2)

## 2021-07-14 LAB — TYPE AND SCREEN
ABO/RH(D): A POS
Antibody Screen: NEGATIVE

## 2021-07-14 LAB — POCT FERN TEST: POCT Fern Test: POSITIVE

## 2021-07-14 MED ORDER — ACETAMINOPHEN 500 MG PO TABS
1000.0000 mg | ORAL_TABLET | Freq: Three times a day (TID) | ORAL | Status: DC | PRN
  Administered 2021-07-14 – 2021-07-24 (×4): 1000 mg via ORAL
  Filled 2021-07-14 (×4): qty 2

## 2021-07-14 MED ORDER — LACTATED RINGERS IV SOLN: INTRAVENOUS | Status: DC

## 2021-07-14 MED ORDER — MAGNESIUM SULFATE BOLUS VIA INFUSION
4.0000 g | Freq: Once | INTRAVENOUS | Status: AC
  Administered 2021-07-14: 4 g via INTRAVENOUS
  Filled 2021-07-14: qty 1000

## 2021-07-14 MED ORDER — SODIUM CHLORIDE 0.9 % IV SOLN
2.0000 g | Freq: Four times a day (QID) | INTRAVENOUS | Status: AC
  Administered 2021-07-14 – 2021-07-16 (×8): 2 g via INTRAVENOUS
  Filled 2021-07-14 (×8): qty 2000

## 2021-07-14 MED ORDER — INSULIN ASPART 100 UNIT/ML IJ SOLN
0.0000 [IU] | Freq: Three times a day (TID) | INTRAMUSCULAR | Status: DC
  Administered 2021-07-14: 6 [IU] via SUBCUTANEOUS
  Administered 2021-07-14: 3 [IU] via SUBCUTANEOUS
  Administered 2021-07-15 (×2): 4 [IU] via SUBCUTANEOUS
  Administered 2021-07-15: 2 [IU] via SUBCUTANEOUS
  Administered 2021-07-16: 3 [IU] via SUBCUTANEOUS
  Administered 2021-07-16 – 2021-07-20 (×10): 2 [IU] via SUBCUTANEOUS
  Administered 2021-07-21: 3 [IU] via SUBCUTANEOUS
  Administered 2021-07-22 (×2): 2 [IU] via SUBCUTANEOUS
  Administered 2021-07-23: 3 [IU] via SUBCUTANEOUS
  Administered 2021-07-23: 2 [IU] via SUBCUTANEOUS
  Administered 2021-07-23 – 2021-07-24 (×2): 3 [IU] via SUBCUTANEOUS
  Administered 2021-07-24: 4 [IU] via SUBCUTANEOUS

## 2021-07-14 MED ORDER — MAGNESIUM SULFATE 40 GM/1000ML IV SOLN
2.0000 g/h | INTRAVENOUS | Status: AC
  Administered 2021-07-14 (×2): 2 g/h via INTRAVENOUS
  Filled 2021-07-14 (×2): qty 1000

## 2021-07-14 MED ORDER — AMOXICILLIN 500 MG PO CAPS
500.0000 mg | ORAL_CAPSULE | Freq: Three times a day (TID) | ORAL | Status: AC
  Administered 2021-07-16 – 2021-07-20 (×15): 500 mg via ORAL
  Filled 2021-07-14 (×15): qty 1

## 2021-07-14 MED ORDER — AZITHROMYCIN 250 MG PO TABS
1000.0000 mg | ORAL_TABLET | Freq: Once | ORAL | Status: AC
  Administered 2021-07-14: 1000 mg via ORAL
  Filled 2021-07-14: qty 4

## 2021-07-14 MED ORDER — BETAMETHASONE SOD PHOS & ACET 6 (3-3) MG/ML IJ SUSP
12.0000 mg | INTRAMUSCULAR | Status: AC
  Administered 2021-07-14 – 2021-07-15 (×2): 12 mg via INTRAMUSCULAR
  Filled 2021-07-14: qty 5

## 2021-07-14 NOTE — MAU Provider Note (Signed)
?History  ?  ? ?CSN: [id number] ? ?Arrival date and time: 07/14/21 0998 ? ? ? ?Chief Complaint  ?Patient presents with  ? Rupture of Membranes  ? ?HPI ?This is a 31yo G3P2002 at [redacted]w[redacted]d who presents with leaking fluid since 6:20am.  She had sex last night.  She denies cramping, contractions. She reports good fetal movement. Has GBS bacteriuria at beginning of pregnancy.  ? ? ?OB History   ? ? Gravida  ?3  ? Para  ?2  ? Term  ?2  ? Preterm  ?   ? AB  ?   ? Living  ?2  ?  ? ? SAB  ?   ? IAB  ?   ? Ectopic  ?   ? Multiple  ?0  ? Live Births  ?2  ?   ?  ?  ? ? ?Past Medical History:  ?Diagnosis Date  ? Gestational diabetes   ? diet controlled, glyburide  ? Headache   ? History of chlamydia   ? Infection   ? uti  ? ? ?Past Surgical History:  ?Procedure Laterality Date  ? NO PAST SURGERIES    ? WISDOM TOOTH EXTRACTION    ? ? ?Family History  ?Problem Relation Age of Onset  ? Hypertension Mother   ? Diabetes Mother   ? Hyperlipidemia Mother   ? Hypertension Maternal Grandmother   ? Cancer Paternal Grandmother   ?     throat  ? ? ?Social History  ? ?Tobacco Use  ? Smoking status: Never  ? Smokeless tobacco: Never  ?Vaping Use  ? Vaping Use: Never used  ?Substance Use Topics  ? Alcohol use: No  ? Drug use: No  ? ? ?Allergies: No Known Allergies ? ?Medications Prior to Admission  ?Medication Sig Dispense Refill Last Dose  ? Prenatal Vit-Fe Fumarate-FA (PRENATAL MULTIVITAMIN) TABS tablet Take 1 tablet by mouth daily at 12 noon.   07/13/2021  ? acetaminophen (TYLENOL) 500 MG tablet Take 1,000 mg by mouth every 6 (six) hours as needed for mild pain or moderate pain. For pain      ? cyclobenzaprine (FLEXERIL) 10 MG tablet Take 1 tablet (10 mg total) by mouth 3 (three) times daily as needed (back pain). 20 tablet 0   ? ? ?Review of Systems ?Physical Exam  ? ?Blood pressure 127/67, pulse 81, temperature 98 ?F (36.7 ?C), temperature source Oral, resp. rate 19, height 5' (1.524 m), weight 77.1 kg, SpO2 100 %, unknown if currently  breastfeeding. ? ?Physical Exam ?Vitals reviewed.  ?Constitutional:   ?   Appearance: Normal appearance.  ?Cardiovascular:  ?   Rate and Rhythm: Normal rate and regular rhythm.  ?   Pulses: Normal pulses.  ?   Heart sounds: Normal heart sounds.  ?Pulmonary:  ?   Effort: Pulmonary effort is normal.  ?   Breath sounds: Normal breath sounds.  ?Abdominal:  ?   General: Abdomen is flat.  ?   Palpations: Abdomen is soft.  ?Skin: ?   General: Skin is warm and dry.  ?   Capillary Refill: Capillary refill takes less than 2 seconds.  ?Neurological:  ?   General: No focal deficit present.  ?   Mental Status: She is alert.  ?Psychiatric:     ?   Mood and Affect: Mood normal.     ?   Behavior: Behavior normal.     ?   Thought Content: Thought content normal.     ?  Judgment: Judgment normal.  ? ? ?MAU Course  ?Procedures ?NST:  ?Baseline: 130  ?Variability: moderate ?Accelerations: ++  ?Decelerations: neg ?Contractions: none ? ? ?MDM ? ? ?Assessment and Plan  ?[redacted]week gestation ?[id number] ?PPROM ?GBS bacteriuria ? ?Start latency antibiotics ?BMZ 12mg  q24 x2 doses ?Magnesium for neuro protection ?Growth ?Consult NICU - Spoke with Dr [place], who agree to consult. ? ?Discussed with Dr Alice Rieger - Admit to antenatal ? ? ?T&S, CBC, RPR, HIV. ?GC/CT. ?Urine culture ? ?Connye Burkitt ?07/14/2021, 8:29 AM  ?

## 2021-07-14 NOTE — MAU Note (Signed)
...  Kathryn Cruz is a 31 y.o. at [redacted]w[redacted]d here in MAU reporting: Water broke around 0620 this morning, currently wearing a pad. Clear fluids, denies any odors. Had intercourse last night. +FM but unsure if she is feeling her baby less. She states she has not been focusing on it. Denies VB. ? ?Pain score: Denies pain. ? ?FHT: 127 initial external ?Lab orders placed from triage:  Crist Fat ? ?

## 2021-07-14 NOTE — Consult Note (Signed)
Asked by Dr.Stinson to provide prenatal consultation for 31 y.o. G3 P2 who is now 31.[redacted] weeks EGA, with pregnancy complicated by PPROM.  She is being treated with betamethasone, magnesium and latency antibiotics.  +GBS urine. Good PNC with GDM diet controlled and now with inpatient contact precautions for +ESBL.  ? ?Discussed with patient and  usual expectations for preterm infant at ~[redacted] weeks gestation, including possible needs for DR resuscitation, respiratory support, IV access, and blood products.  Explained family-centered care approach including parent participation in rounds, decision-making, and also presented usual criteria for discharge. Projected possible length of stay in NICU until 37 - [redacted] wks EGA. ? ?Discussed advantages of feeding with mother's milk and possible use of donor milk as "bridge" if needed until her supply is sufficient (parents agreeable). ? ?They were attentive, had appropriate questions, and were appreciative of my time and input. ? ?Thank you for consulting Neonatology. ? ?Total time 40, face-to-face time 25 ? ?Dineen Kid Leary Roca, MD ?Neonatologist ?07/14/2021, 10:55 AM ? ? ?

## 2021-07-14 NOTE — H&P (Signed)
HPI: 31 y.o. [id number] @ [redacted]w[redacted]d estimated gestational age (as dated by 6 week ultrasound) presents for PPROM. She had a large gush of clear fluid this morning. Confirmed grossly ruptured in MAU. Fern positive. ? ?Feels continuous leakage of clear fluid ? ?Leakage of fluid:  Yes ?Vaginal bleeding:  No ?Contractions:  No ?Fetal movement:  Yes ? ?Prenatal care has been provided by Dr. Katrinka Blazing. Connye Burkitt [facility]) ? ?ROS:  Denies fevers, chills, chest pain, visual changes, SOB, RUQ/epigastric pain, N/V, dysuria, hematuria, or sudden onset/worsening bilateral LE or facial edema. ? ?Pregnancy complicated by: ?Class A1DM ?Fetal renal pyelectasis (R kidney 0.4cm, Left kidney 0.4cm --> resolved) ?UTI x 1 this pregnancy ?Pre-diabetes ?GBS Positive (urine) ? ?Prenatal Transfer Tool  ?Maternal Diabetes: Yes:  Diabetes Type:  Diet controlled ?Genetic Screening: Declined ?Maternal Ultrasounds/Referrals: Normal ?Fetal Ultrasounds or other Referrals:  None ?Maternal Substance Abuse:  No ?Significant Maternal Medications:  None ?Significant Maternal Lab Results: Group B Strep positive ? ? ?Prenatal Labs ?Blood type:  A Positive ?Antibody screen:  Negative ?CBC:  H/H 10.3/31.0 ?Rubella: Immune ?RPR:  Non-reactive ?Hep B:  Negative ?Hep C:  Negative ?HIV:  Negative ?GC/CT:  Negative ?Glucola:  135.4 (abnormal)  3h GTT abnormal ? ?Immunizations: ?Tdap: Given prenatally ?Flu: Received 06/13/21 ? ?OBHx:  ?OB History   ? ? Gravida  ?3  ? Para  ?2  ? Term  ?2  ? Preterm  ?   ? AB  ?   ? Living  ?2  ?  ? ? SAB  ?   ? IAB  ?   ? Ectopic  ?   ? Multiple  ?0  ? Live Births  ?2  ?   ?  ?  ? ?PMHx:  See above ?Meds:  PNV ?Allergy:  No Known Allergies ?SurgHx: None ?SocHx:   Denies Tobacco, ETOH, illicit drugs ? ?O: BP 117/62 (BP Location: Right Arm)   Pulse 78   Temp 97.7 ?F (36.5 ?C) (Oral)   Resp 18   Ht 5' (1.524 m)   Wt 77.1 kg   SpO2 98%   BMI 33.20 kg/m?  ?Gen. AAOx3, NAD ?CV.  RRR  ?Resp. CTAB, no wheezes/rales/rhonchi ?Abd.  Gravid, soft, non-tender throughout, no rebound/guarding ?Extr.  No bilateral LE edema, no calf tenderness bilaterally ?SSE: No vulvar/vaginal/cervical lesions.  No blood visualized in vaginal vault. Cervix multiparous-appearing and appears closed. Grossly leaking clear fluid from vagina. ? ?Last growth [place] (07/10/21):   [redacted]w[redacted]d EFW 2008g, 4 lbs 7 oz (81%), AAFV, Cephalic, anterior placenta, bilateral kidneys WNL ? ? ?FHT: 145 baseline, moderate variability, + accels,  - decels ?Toco: None ? ?Labs: see orders ? ?A/P:  31 y.o. [id number] @ [redacted]w[redacted]d who is admitted for PPROM. ? ?- Admit to [facility] ?- Admit labs (CBC, T&S, COVID screen per protocol) ?- CEFM/Toco ?- FWB:  Category I ?- Diet:  Carb-controlled ?- IVF:  SLIV ?- VTE Prophylaxis:  SCDs ?- GBS Status:  Positive (urine) ?- Presentation:  Final read pending but verbally BREECH ?- Antibiotics: Amp/Amox and Azithro ordered for latency ?- FLM: Betamethasone x 2 doses ordered ?- Magnesium sulfate ordered for fetal neuroprotection x 24h ?- NICU consult completed ?- Discussed risks/benefits of expectant management until 34 vs 36 weeks - given malpresentation, recommend delivery at 34 weeks which patient is in agreement ? ?Class A1DM ?- CBG fasting and 2h postprandial ? ?UTI x 1 this pregnancy ?- S/p treatment ?- Repeat urine culture ordered and pending ? ?Consents: ?  I have explained to the patient that this surgery is performed to deliver their baby or babies through an incision in the abdomen and incision in the uterus.  Prior to surgery, the risks and benefits of the surgery, as well as alternative treatments were discussed.  The risks include, but are not limited to, possible need for cesarean delivery for all future pregnancies, bleeding at the time of surgery that could necessitate a blood transfusion and/or hysterectomy, rupture of the uterus during a future pregnancy that could cause a preterm delivery and/or requiring hysterectomy, infection, damage to  surrounding organs and tissues, damage to bladder, damage to ureters, causing kidney damage, and requiring additional procedures, damage to bowels, resulting in further surgery, postoperative pain, short-term and long-term, scarring on the abdominal wall and intra-abdominally, need for further surgery, development of an incisional hernia, deep vein thrombosis and/or pulmonary embolism, wound infection and/or separation, painful intercourse, urinary leakage, impact on future pregnancies including but not limited to, abnormal location or attachment of the placenta to the uterus, such as placenta previa or accreta, that may necessitate a blood transfusion and/or hysterectomy, impact on total family size, complications the course of which cannot be predicted or prevented, and death. ?Patient was consented for blood products.  The patient is aware that bleeding may result in the need for a blood transfusion which includes risk of transmission of HIV (1:2 million), Hepatitis C (1:2 million), and Hepatitis B (1:200 thousand) and transfusion reaction.  Patient voiced understanding of the above risks as well as understanding of indications for blood transfusion. ?Patient was consented for blood products.  The patient is aware that bleeding may result in the need for a blood transfusion which includes risk of transmission of HIV (1:2 million), Hepatitis C (1:2 million), and Hepatitis B (1:200 thousand) and transfusion reaction.  Patient voiced understanding of the above risks as well as understanding of indications for blood transfusion. ? ? ?Steva Ready, DO ?[phone] (office) ? ? ? ? ? ?

## 2021-07-14 NOTE — Progress Notes (Signed)
Cesarean section and blood transfusion consents signed and placed in patient's chart.  ?

## 2021-07-15 LAB — GLUCOSE, CAPILLARY
Glucose-Capillary: 114 mg/dL — ABNORMAL HIGH (ref 70–99)
Glucose-Capillary: 117 mg/dL — ABNORMAL HIGH (ref 70–99)
Glucose-Capillary: 172 mg/dL — ABNORMAL HIGH (ref 70–99)
Glucose-Capillary: 165 mg/dL — ABNORMAL HIGH (ref 70–99)

## 2021-07-15 LAB — URINE CULTURE: Culture: NO GROWTH

## 2021-07-15 LAB — GC/CHLAMYDIA PROBE AMP (~~LOC~~) NOT AT ARMC
Chlamydia: NEGATIVE
Comment: NEGATIVE
Comment: NORMAL
Neisseria Gonorrhea: NEGATIVE

## 2021-07-15 MED ORDER — ONDANSETRON 4 MG PO TBDP
4.0000 mg | ORAL_TABLET | Freq: Three times a day (TID) | ORAL | Status: DC | PRN
  Administered 2021-07-18: 4 mg via ORAL
  Filled 2021-07-15: qty 1

## 2021-07-15 MED ORDER — ONDANSETRON HCL 4 MG/2ML IJ SOLN
4.0000 mg | Freq: Once | INTRAMUSCULAR | Status: AC
  Administered 2021-07-15: 4 mg via INTRAVENOUS
  Filled 2021-07-15: qty 2

## 2021-07-15 NOTE — Progress Notes (Addendum)
Antepartum Progress Note ? ?Hospital Day#: 1 ?PPROM Date: 07/14/2021 ? ?Subjective: Patient doing well. Reports seeing slight/minimal blood-tinged fluid. No foul-smelling discharge. Endorses good FM. Denies VB, LOF, or CTX. ? ?Denies fevers, chills, chest pain, visual changes, SOB, RUQ/epigastric pain, N/V, dysuria, hematuria, or sudden onset/worsening bilateral LE or facial edema. ? ?Objective: ?BP 104/64 (BP Location: Right Arm)   Pulse 84   Temp 98 ?F (36.7 ?C) (Oral)   Resp 17   Ht 5' (1.524 m)   Wt 77.1 kg   SpO2 99%   BMI 33.20 kg/m?  ?Gen:  NAD, pleasant and cooperative ?Cardio:  RRR ?Pulm:  CTAB, no wheezes/rales/rhonchi ?Abd:  Soft, gravid, non-distended, non-tender throughout, no rebound/guarding ?Ext:  No bilateral LE edema, no bilateral calf tenderness ? ?FHT: 130bpm, moderate variability, 15x15 accelerations, no decelerations ?Toco: None ? ?Results for orders placed or performed during the hospital encounter of 07/14/21  ?OB RESULT CONSOLE Group B Strep  ?Result Value Ref Range  ? GBS Positive   ?CBC  ?Result Value Ref Range  ? WBC 9.8 4.0 - 10.5 K/uL  ? RBC 4.19 3.87 - 5.11 MIL/uL  ? Hemoglobin 10.0 (L) 12.0 - 15.0 g/dL  ? HCT 32.7 (L) 36.0 - 46.0 %  ? MCV 78.0 (L) 80.0 - 100.0 fL  ? MCH 23.9 (L) 26.0 - 34.0 pg  ? MCHC 30.6 30.0 - 36.0 g/dL  ? RDW 13.7 11.5 - 15.5 %  ? Platelets 302 150 - 400 K/uL  ? nRBC 0.0 0.0 - 0.2 %  ?HIV Antibody (routine testing w rflx)  ?Result Value Ref Range  ? HIV Screen 4th Generation wRfx Non Reactive Non Reactive  ?RPR  ?Result Value Ref Range  ? RPR Ser Ql NON REACTIVE NON REACTIVE  ?Glucose, capillary  ?Result Value Ref Range  ? Glucose-Capillary 91 70 - 99 mg/dL  ?Glucose, capillary  ?Result Value Ref Range  ? Glucose-Capillary 210 (H) 70 - 99 mg/dL  ?Glucose, capillary  ?Result Value Ref Range  ? Glucose-Capillary 88 70 - 99 mg/dL  ?Glucose, capillary  ?Result Value Ref Range  ? Glucose-Capillary 144 (H) 70 - 99 mg/dL  ?Glucose, capillary  ?Result Value Ref  Range  ? Glucose-Capillary 114 (H) 70 - 99 mg/dL  ?Fern Test  ?Result Value Ref Range  ? POCT Fern Test Positive = ruptured amniotic membanes   ?Type and screen [place]  ?Result Value Ref Range  ? ABO/RH(D) A POS   ? Antibody Screen NEG   ? Sample Expiration    ?  07/17/2021,2359 ?Performed at [facility], [place] [address], [place], [place] [address] ?  ? ? ? ?A/P: ?Kathryn Cruz is a 31 y.o. [id number] @ [redacted]w[redacted]d admitted for PPROM. ? ?PPROM ?- Doing well, vital signs stable, no signs/symptoms of chorio ?- FLM: Betamethasone x 2 doses ordered ?- Antibiotics: Amp/Amox/Azithro for latency x 7 days ?- Magnesium sulfate x 24 hours - ends this AM at 0830 ?- GBS: Positive (through urine) ?- FWB: Category I, will change from continuous to NSTs qshift ?- Presentation: Fetus breech on [place] 3/29  ?- Fetal growth appropriate, plan for BPP on 3/30 per MFM ?- S/p NICU consult on 3/27 ?- Consented for C/S and blood products on 3/27  ?- Inpatient management until delivery at 34 weeks unless signs/symptoms of chorio develop ? ?Class A1DM ?- CBG fasting and 2h postprandial ?- BS 83-210 (likely due to BMZ) ?- SSI ordered ? ?UTI x 1 this pregnancy ?-  S/p treatment ?- Repeat urine culture pending ? ? ?Routine care: ?- Pulm:  RA ?- GI:  Carb-controlled ?- Renal:  Spontaneous voids ?- IVF:  SLIV  ?- Pain:  N/A ?- DVT Prophylaxis:  SCDs in bed, ambulation ?- Activity:  May ambulate ?- Labs:  As above ? ?Drema Dallas, DO ? ? ?

## 2021-07-16 LAB — GLUCOSE, CAPILLARY
Glucose-Capillary: 108 mg/dL — ABNORMAL HIGH (ref 70–99)
Glucose-Capillary: 155 mg/dL — ABNORMAL HIGH (ref 70–99)
Glucose-Capillary: 117 mg/dL — ABNORMAL HIGH (ref 70–99)
Glucose-Capillary: 99 mg/dL (ref 70–99)

## 2021-07-16 MED ORDER — DOCUSATE SODIUM 100 MG PO CAPS
100.0000 mg | ORAL_CAPSULE | Freq: Two times a day (BID) | ORAL | Status: DC
  Administered 2021-07-16 – 2021-07-24 (×14): 100 mg via ORAL
  Filled 2021-07-16 (×16): qty 1

## 2021-07-16 MED ORDER — PRENATAL MULTIVITAMIN CH
1.0000 | ORAL_TABLET | Freq: Every day | ORAL | Status: DC
  Administered 2021-07-17 – 2021-07-24 (×8): 1 via ORAL
  Filled 2021-07-16 (×8): qty 1

## 2021-07-16 NOTE — Consult Note (Incomplete)
Neonatology Consult to Antenatal Patient: ? ?I was asked by Dr. Marland Kitchen to see this patient in order to provide antenatal counseling due to ***. ? ?Ms. *** was admitted *** at *** weeks GA. She is currently *** having active labor. She is getting BMZ and IV Ampicillin. ? ?I spoke with the patient ***. We discussed the worst case of delivery in the next 1-2 days, including usual DR management, possible respiratory complications and need for support, IV access, feedings (mother desires breast feeding, which was encouraged), LOS, Mortality and Morbidity, and long term outcomes. She did not have any questions at this time. I offered a NICU tour to any interested family members and would be glad to come back if she has more questions later. ? ?Thank you for asking me to see this patient. ? ?Dineen Kid. Leary Roca, MD ?Neonatologist ? ?The total length of face-to-face or floor/unit time for this encounter was 25 minutes. ?Counseling and/or coordination of care was 40 minutes of the above. ? ?

## 2021-07-16 NOTE — Progress Notes (Signed)
Antepartum Progress Note ? ?Hospital Day#: 2 ?PPROM Date: 07/14/2021 ? ?Subjective: Patient doing well. Reports small leakage of clear fluid. No foul-smelling discharge. Endorses good FM. Denies VB, LOF, or CTX. ? ?Denies fevers, chills, chest pain, visual changes, SOB, RUQ/epigastric pain, N/V, dysuria, hematuria, or sudden onset/worsening bilateral LE or facial edema. ? ?Objective: ?BP (!) 97/59 (BP Location: Right Arm)   Pulse 66   Temp 98.4 ?F (36.9 ?C) (Oral)   Resp 17   Ht 5' (1.524 m)   Wt 77.1 kg   SpO2 98%   BMI 33.20 kg/m?  ?Gen:  NAD, pleasant and cooperative ?Cardio:  RRR ?Pulm:  No increased work of breathing ?Abd:  Soft, gravid, non-distended, non-tender throughout, no rebound/guarding ?Ext:  No bilateral LE edema, no bilateral calf tenderness ? ?FHT (midnight): 125bpm, moderate variability, 15x15 accelerations, no decelerations ?Toco: None ? ?Results for orders placed or performed during the hospital encounter of 07/14/21  ?Urine Culture  ? Specimen: Urine, Clean Catch  ?Result Value Ref Range  ? Specimen Description URINE, CLEAN CATCH   ? Special Requests NONE   ? Culture    ?  NO GROWTH ?Performed at [facility], [address], [place], [place] [address] ?  ? Report Status 07/15/2021 FINAL   ?OB RESULT CONSOLE Group B Strep  ?Result Value Ref Range  ? GBS Positive   ?CBC  ?Result Value Ref Range  ? WBC 9.8 4.0 - 10.5 K/uL  ? RBC 4.19 3.87 - 5.11 MIL/uL  ? Hemoglobin 10.0 (L) 12.0 - 15.0 g/dL  ? HCT 32.7 (L) 36.0 - 46.0 %  ? MCV 78.0 (L) 80.0 - 100.0 fL  ? MCH 23.9 (L) 26.0 - 34.0 pg  ? MCHC 30.6 30.0 - 36.0 g/dL  ? RDW 13.7 11.5 - 15.5 %  ? Platelets 302 150 - 400 K/uL  ? nRBC 0.0 0.0 - 0.2 %  ?HIV Antibody (routine testing w rflx)  ?Result Value Ref Range  ? HIV Screen 4th Generation wRfx Non Reactive Non Reactive  ?RPR  ?Result Value Ref Range  ? RPR Ser Ql NON REACTIVE NON REACTIVE  ?Glucose, capillary  ?Result Value Ref Range  ? Glucose-Capillary 91 70 - 99 mg/dL  ?Glucose,  capillary  ?Result Value Ref Range  ? Glucose-Capillary 210 (H) 70 - 99 mg/dL  ?Glucose, capillary  ?Result Value Ref Range  ? Glucose-Capillary 88 70 - 99 mg/dL  ?Glucose, capillary  ?Result Value Ref Range  ? Glucose-Capillary 144 (H) 70 - 99 mg/dL  ?Glucose, capillary  ?Result Value Ref Range  ? Glucose-Capillary 114 (H) 70 - 99 mg/dL  ?Glucose, capillary  ?Result Value Ref Range  ? Glucose-Capillary 117 (H) 70 - 99 mg/dL  ?Glucose, capillary  ?Result Value Ref Range  ? Glucose-Capillary 172 (H) 70 - 99 mg/dL  ?Glucose, capillary  ?Result Value Ref Range  ? Glucose-Capillary 165 (H) 70 - 99 mg/dL  ?Glucose, capillary  ?Result Value Ref Range  ? Glucose-Capillary 108 (H) 70 - 99 mg/dL  ?Fern Test  ?Result Value Ref Range  ? POCT Fern Test Positive = ruptured amniotic membanes   ?Type and screen [facility]  ?Result Value Ref Range  ? ABO/RH(D) A POS   ? Antibody Screen NEG   ? Sample Expiration    ?  07/17/2021,2359 ?Performed at [facility], [address] [place], [place] [address] ?  ?GC/Chlamydia probe amp ([place])not at [facility]  ?Result Value Ref Range  ?  Neisseria Gonorrhea Negative   ? Chlamydia Negative   ? Comment Normal Reference Ranger Chlamydia - Negative   ? Comment    ?  Normal Reference Range Neisseria Gonorrhea - Negative  ? ? ? ?A/P: ?Kathryn Cruz is a 31 y.o. [id number] @ [redacted]w[redacted]d admitted for PPROM. ? ?PPROM ?- Doing well, vital signs stable, no signs/symptoms of chorio ?- FLM: Betamethasone complete 3/29 ?- Antibiotics: Amp/Amox/Azithro for latency x 7 days ?- S/p Magnesium sulfate for fetal neuroprotection x 24h ?- GBS: Positive (through urine) ?- NSTs q shift ?- Presentation: Fetus breech on [place] 3/29  ?- Fetal growth appropriate, plan for BPP on 3/30 per MFM ?- S/p NICU consult on 3/27 ?- Consented for C/S and blood products on 3/27 ?- Primary CS scheduled for 4/11 at 1230 ?- Inpatient management until delivery at 34 weeks unless signs/symptoms of chorio  develop ? ?Class A1DM ?- CBG fasting and 2h postprandial ?- BS 110s-172 (likely due to BMZ) ?- SSI ordered ? ?UTI x 1 this pregnancy ?- S/p treatment ?- Repeat urine culture is negative ? ?Routine care: ?- Pulm:  RA ?- GI:  Carb-controlled ?- Renal:  Spontaneous voids ?- IVF:  SLIV  ?- Pain:  N/A ?- DVT Prophylaxis:  SCDs in bed, ambulation ?- Activity:  May ambulate ?- Labs:  As above ? ?Steva Ready, DO ? ? ?

## 2021-07-17 LAB — GLUCOSE, CAPILLARY
Glucose-Capillary: 77 mg/dL (ref 70–99)
Glucose-Capillary: 78 mg/dL (ref 70–99)
Glucose-Capillary: 93 mg/dL (ref 70–99)
Glucose-Capillary: 72 mg/dL (ref 70–99)

## 2021-07-17 LAB — CBC
HCT: 25.2 % — ABNORMAL LOW (ref 36.0–46.0)
Hemoglobin: 7.9 g/dL — ABNORMAL LOW (ref 12.0–15.0)
MCH: 24.5 pg — ABNORMAL LOW (ref 26.0–34.0)
MCHC: 31.3 g/dL (ref 30.0–36.0)
MCV: 78.3 fL — ABNORMAL LOW (ref 80.0–100.0)
Platelets: 235 10*3/uL (ref 150–400)
RBC: 3.22 MIL/uL — ABNORMAL LOW (ref 3.87–5.11)
RDW: 13.8 % (ref 11.5–15.5)
WBC: 8.5 10*3/uL (ref 4.0–10.5)
nRBC: 0 % (ref 0.0–0.2)

## 2021-07-17 LAB — TYPE AND SCREEN
ABO/RH(D): A POS
Antibody Screen: NEGATIVE

## 2021-07-17 MED ORDER — SODIUM CHLORIDE 0.9 % IV SOLN: INTRAVENOUS | Status: DC | PRN

## 2021-07-17 MED ORDER — METHYLPREDNISOLONE SODIUM SUCC 125 MG IJ SOLR: 125.0000 mg | Freq: Once | INTRAMUSCULAR | Status: DC | PRN

## 2021-07-17 MED ORDER — SODIUM CHLORIDE 0.9% FLUSH
3.0000 mL | Freq: Two times a day (BID) | INTRAVENOUS | Status: DC
  Administered 2021-07-18 – 2021-07-23 (×11): 3 mL via INTRAVENOUS

## 2021-07-17 MED ORDER — EPINEPHRINE PF 1 MG/ML IJ SOLN
0.3000 mg | Freq: Once | INTRAMUSCULAR | Status: DC | PRN
  Filled 2021-07-17: qty 1

## 2021-07-17 MED ORDER — ALBUTEROL SULFATE (2.5 MG/3ML) 0.083% IN NEBU: 2.5000 mg | INHALATION_SOLUTION | Freq: Once | RESPIRATORY_TRACT | Status: DC | PRN

## 2021-07-17 MED ORDER — DIPHENHYDRAMINE HCL 50 MG/ML IJ SOLN: 25.0000 mg | Freq: Once | INTRAMUSCULAR | Status: DC | PRN

## 2021-07-17 MED ORDER — SODIUM CHLORIDE 0.9 % IV BOLUS: 500.0000 mL | Freq: Once | INTRAVENOUS | Status: DC | PRN

## 2021-07-17 MED ORDER — SODIUM CHLORIDE 0.9 % IV SOLN
500.0000 mg | INTRAVENOUS | Status: DC
  Administered 2021-07-17: 500 mg via INTRAVENOUS
  Filled 2021-07-17 (×2): qty 25

## 2021-07-17 NOTE — Progress Notes (Signed)
Antepartum Progress Note   Hospital Day#: 3 PPROM Date: 07/14/2021   Subjective: Patient doing well. Reports small leakage of clear fluid.  reports some cramping this morning but it has resolved. She denies contractions. Or abdominal tenderness    Objective: Vitals:   07/17/21 0352 07/17/21 0915 07/17/21 1304 07/17/21 1612  BP: (!) 90/53 (!) 99/57 (!) 110/54 100/60  Pulse: (!) 53 62 68 70  Resp: 16 19 17 19   Temp: 98 F (36.7 C) 97.9 F (36.6 C) 97.9 F (36.6 C) 97.9 F (36.6 C)  TempSrc: Oral Oral Oral Oral  SpO2: 99% 99% 100% 100%  Weight:      Height:        Gen:  NAD, pleasant and cooperative Pulm:  No increased work of breathing Abd:  Soft, gravid, non-distended, non-tender throughout, no rebound/guarding Ext:  No bilateral LE edema, no bilateral calf tenderness   FHT: Reactive NST  this morning.  Toco: None  Results for orders placed or performed during the hospital encounter of 07/14/21 (from the past 24 hour(s))  Glucose, capillary     Status: None   Collection Time: 07/16/21 10:39 PM  Result Value Ref Range   Glucose-Capillary 99 70 - 99 mg/dL  Glucose, capillary     Status: None   Collection Time: 07/17/21  5:26 AM  Result Value Ref Range   Glucose-Capillary 77 70 - 99 mg/dL  CBC     Status: Abnormal   Collection Time: 07/17/21  8:04 AM  Result Value Ref Range   WBC 8.5 4.0 - 10.5 K/uL   RBC 3.22 (L) 3.87 - 5.11 MIL/uL   Hemoglobin 7.9 (L) 12.0 - 15.0 g/dL   HCT 07/19/21 (L) 79.4 - 80.1 %   MCV 78.3 (L) 80.0 - 100.0 fL   MCH 24.5 (L) 26.0 - 34.0 pg   MCHC 31.3 30.0 - 36.0 g/dL   RDW 65.5 37.4 - 82.7 %   Platelets 235 150 - 400 K/uL   nRBC 0.0 0.0 - 0.2 %  Type and screen [facility]     Status: None   Collection Time: 07/17/21  8:04 AM  Result Value Ref Range   ABO/RH(D) A POS    Antibody Screen NEG    Sample Expiration      07/20/2021,2359 Performed at [facility], [address] [place], [place]   Glucose,  capillary     Status: None   Collection Time: 07/17/21 12:53 PM  Result Value Ref Range   Glucose-Capillary 78 70 - 99 mg/dL  Glucose, capillary     Status: None   Collection Time: 07/17/21  4:43 PM  Result Value Ref Range   Glucose-Capillary 93 70 - 99 mg/dL    A/P: Kathryn Cruz is a 31 y.o. [id number] @ [redacted]w[redacted]d admitted for PPROM.   PPROM - Doing well, vital signs stable, no signs/symptoms of chorio - FLM: Betamethasone complete 3/29 - Antibiotics: Amp/Amox/Azithro for latency x 7 days - S/p Magnesium sulfate for fetal neuroprotection x 24h - GBS: Positive (through urine) - NSTs q shift - Presentation: Fetus breech on 4/29 3/29  - Fetal growth appropriate, plan for BPP on 3/30 per MFM - S/p NICU consult on 3/27 - Consented for C/S and blood products on 3/27 - Primary CS scheduled for 4/11 at 1230 - Inpatient management until delivery at 34 weeks unless signs/symptoms of chorio develop   Class A1DM - CBG fasting and 2h postprandial - BS 110s-172 (likely  due to BMZ) - SSI ordered   UTI x 1 this pregnancy - S/p treatment - Repeat urine culture is negative   Iron deficiency anemia.. Plan IV iron infusion.   Routine care: - Pulm:  RA - GI:  Carb-controlled - Renal:  Spontaneous voids - IVF:  SLIV  - Pain:  N/A - DVT Prophylaxis:  SCDs in bed, ambulation - Activity:  May ambulate - Labs:  As above

## 2021-07-18 ENCOUNTER — Inpatient Hospital Stay (HOSPITAL_BASED_OUTPATIENT_CLINIC_OR_DEPARTMENT_OTHER): Payer: BC Managed Care – PPO

## 2021-07-18 DIAGNOSIS — O42913 Preterm premature rupture of membranes, unspecified as to length of time between rupture and onset of labor, third trimester: Secondary | ICD-10-CM

## 2021-07-18 DIAGNOSIS — Z3A32 32 weeks gestation of pregnancy: Secondary | ICD-10-CM | POA: Diagnosis not present

## 2021-07-18 LAB — GLUCOSE, CAPILLARY
Glucose-Capillary: 74 mg/dL (ref 70–99)
Glucose-Capillary: 110 mg/dL — ABNORMAL HIGH (ref 70–99)
Glucose-Capillary: 100 mg/dL — ABNORMAL HIGH (ref 70–99)
Glucose-Capillary: 93 mg/dL (ref 70–99)

## 2021-07-18 NOTE — Plan of Care (Signed)

## 2021-07-18 NOTE — Progress Notes (Signed)
Antepartum Progress Note ?  ?Hospital Day#: 4 ?PPROM Date: 07/14/2021 ?  ?Subjective: Patient doing well. Reports having had some mild abdominal cramping yesterday but it has resolved. Husband went home to rest. Endorses good FM. Denies VB or CTX. ? ?Denies fevers, chills, chest pain, visual changes, SOB, RUQ/epigastric pain, N/V, dysuria, hematuria, or sudden onset/worsening bilateral LE or facial edema. ? ?  ?Objective: ?Vitals:  ? 07/17/21 1612 07/17/21 1930 07/17/21 2352 07/18/21 0351  ?BP: 100/60 108/60 (!) 107/56 107/63  ?Pulse: 70 66 64 61  ?Resp: 19 18 19 18   ?Temp: 97.9 ?F (36.6 ?C) 98.1 ?F (36.7 ?C) 97.9 ?F (36.6 ?C) 98.1 ?F (36.7 ?C)  ?TempSrc: Oral Oral Oral Oral  ?SpO2: 100% 100% 100% 100%  ?Weight:      ?Height:      ?  ?Gen:  NAD, pleasant and cooperative ?Cardio:  RRR ?Pulm:  No increased work of breathing ?Abd:  Soft, gravid, non-distended, non-tender throughout, no rebound/guarding ?Ext:  No bilateral LE edema, no bilateral calf tenderness ?  ?FHT (~2100 last night): 135-150bpm, moderate variability, 15x15 accels, no decelerations ?Toco: None ? ?Results for orders placed or performed during the hospital encounter of 07/14/21 (from the past 24 hour(s))  ?CBC     Status: Abnormal  ? Collection Time: 07/17/21  8:04 AM  ?Result Value Ref Range  ? WBC 8.5 4.0 - 10.5 K/uL  ? RBC 3.22 (L) 3.87 - 5.11 MIL/uL  ? Hemoglobin 7.9 (L) 12.0 - 15.0 g/dL  ? HCT 25.2 (L) 36.0 - 46.0 %  ? MCV 78.3 (L) 80.0 - 100.0 fL  ? MCH 24.5 (L) 26.0 - 34.0 pg  ? MCHC 31.3 30.0 - 36.0 g/dL  ? RDW 13.8 11.5 - 15.5 %  ? Platelets 235 150 - 400 K/uL  ? nRBC 0.0 0.0 - 0.2 %  ?Type and screen [facility]     Status: None  ? Collection Time: 07/17/21  8:04 AM  ?Result Value Ref Range  ? ABO/RH(D) A POS   ? Antibody Screen NEG   ? Sample Expiration    ?  07/20/2021,2359 ?Performed at [facility], [address], [place], [place] ?  ?Glucose, capillary     Status: None  ? Collection Time: 07/17/21 12:53  PM  ?Result Value Ref Range  ? Glucose-Capillary 78 70 - 99 mg/dL  ?Glucose, capillary     Status: None  ? Collection Time: 07/17/21  4:43 PM  ?Result Value Ref Range  ? Glucose-Capillary 93 70 - 99 mg/dL  ?Glucose, capillary     Status: None  ? Collection Time: 07/17/21  9:29 PM  ?Result Value Ref Range  ? Glucose-Capillary 72 70 - 99 mg/dL  ? Comment 1 Notify RN   ?Glucose, capillary     Status: None  ? Collection Time: 07/18/21  5:58 AM  ?Result Value Ref Range  ? Glucose-Capillary 74 70 - 99 mg/dL  ? Comment 1 Notify RN   ?  ?A/P: ?Kathryn Cruz is a 31 y.o. 34 @ [redacted]w[redacted]d admitted for PPROM. ?  ?PPROM ?- Doing well, vital signs stable, no signs/symptoms of chorio ?- FLM: Betamethasone complete 3/29 ?- Antibiotics: Amp/Amox/Azithro for latency x 7 days ?- S/p Magnesium sulfate for fetal neuroprotection x 24h ?- GBS: Positive (through urine) ?- NSTs q shift ?- Presentation: Fetus breech on 4/29 3/29  ?- Fetal growth appropriate, BPP ordered for today ?- S/p NICU consult on 3/27 ?- Consented for C/S  and blood products on 3/27 ?- Primary CS scheduled for 4/11 at 1230 ?- Inpatient management until delivery at 34 weeks unless signs/symptoms of chorio develop ?  ?Class A1DM ?- CBG fasting and 2h postprandial ?- SSI ordered ?- BS 70-90s ?  ?Iron deficiency anemia ?- H/H 7.9/25.2 on 07/17/21 ?- S/p IV Venofer on 3/30 ? ?Routine care: ?- Pulm:  RA ?- GI:  Carb-controlled ?- Renal:  Spontaneous voids ?- IVF:  SLIV  ?- Pain:  N/A ?- DVT Prophylaxis:  SCDs in bed, ambulation ?- Activity:  May ambulate ?- Labs:  As above ? ?Steva Ready, DO ? ?

## 2021-07-19 LAB — GLUCOSE, CAPILLARY
Glucose-Capillary: 91 mg/dL (ref 70–99)
Glucose-Capillary: 83 mg/dL (ref 70–99)
Glucose-Capillary: 107 mg/dL — ABNORMAL HIGH (ref 70–99)

## 2021-07-19 NOTE — Progress Notes (Signed)
FHT Tracing Review: ? ?Called by primary RN to review NST. Baseline 145bpm, moderate variability, 15x15 accelerations, two small variables. No contractions on toco. Overall, reassuring FHT. NST to be done at midnight. ? ?Steva Ready, DO ? ?

## 2021-07-19 NOTE — Progress Notes (Signed)
Antepartum Progress Note ?  ?Hospital Day#: 5 ?PPROM Date: 07/14/2021 ?  ?Subjective: Patient doing well. Has no concerns today. Her child came to visit yesterday. Endorses good FM. Denies VB or CTX. ? ?Denies fevers, chills, chest pain, visual changes, SOB, RUQ/epigastric pain, N/V, dysuria, hematuria, or sudden onset/worsening bilateral LE or facial edema. ? ?  ?Objective: ?Vitals:  ? 07/18/21 1925 07/18/21 2300 07/19/21 0356 07/19/21 0731  ?BP: (!) 96/57 (!) 101/58 104/61 104/66  ?Pulse: 63 62 65 (!) 58  ?Resp: 18 18 18 18   ?Temp: 98 ?F (36.7 ?C) 98.2 ?F (36.8 ?C) 98.3 ?F (36.8 ?C) 98.2 ?F (36.8 ?C)  ?TempSrc: Oral Oral Oral Oral  ?SpO2: 98% 98% 100% 100%  ?Weight:      ?Height:      ?  ?Gen:  NAD, pleasant and cooperative ?Cardio:  RRR ?Pulm:  No increased work of breathing ?Abd:  Soft, gravid, non-distended, non-tender throughout, no rebound/guarding ?Ext:  No bilateral LE edema, no bilateral calf tenderness ?  ?FHT (~2148): 145bpm, moderate variability, 15x15 accels, no decelerations ?Toco: one noted  ? ?Results for orders placed or performed during the hospital encounter of 07/14/21 (from the past 24 hour(s))  ?Glucose, capillary     Status: Abnormal  ? Collection Time: 07/18/21 12:30 PM  ?Result Value Ref Range  ? Glucose-Capillary 110 (H) 70 - 99 mg/dL  ?Glucose, capillary     Status: Abnormal  ? Collection Time: 07/18/21  7:20 PM  ?Result Value Ref Range  ? Glucose-Capillary 100 (H) 70 - 99 mg/dL  ?Glucose, capillary     Status: None  ? Collection Time: 07/18/21  9:57 PM  ?Result Value Ref Range  ? Glucose-Capillary 93 70 - 99 mg/dL  ?Glucose, capillary     Status: None  ? Collection Time: 07/19/21  5:58 AM  ?Result Value Ref Range  ? Glucose-Capillary 91 70 - 99 mg/dL  ?  ?A/P: ?Kathryn Cruz is a 31 y.o. 34 @ [redacted]w[redacted]d admitted for PPROM. ?  ?PPROM ?- Doing well, vital signs stable, no signs/symptoms of chorio ?- FLM: Betamethasone complete 3/29 ?- Antibiotics: Amp/Amox/Azithro for latency x 7  days ?- S/p Magnesium sulfate for fetal neuroprotection x 24h ?- GBS: Positive (through urine) ?- NSTs q shift ?- Presentation: Fetus breech on 4/29 3/31 ?- Fetal growth appropriate on MFM growth 4/31 on 3/27 ?- BPP on 3/31 8/8, fetus still breech -- repeat ordered on 4/7 ?- S/p NICU consult on 3/27 ?- Consented for C/S and blood products on 3/27 ?- Primary CS scheduled for 4/11 at 1230 ?- Inpatient management until delivery at 34 weeks unless signs/symptoms of chorio develop ?  ?Class A1DM ?- CBG fasting and 2h postprandial ?- SSI ordered ?- BS 70-100 ?  ?Iron deficiency anemia ?- H/H 7.9/25.2 on 07/17/21 ?- S/p IV Venofer on 3/30 ?- Repeat CBC ordered for 4/7 ? ?Contact precautions ?- Per protocol contact precautions x 1 year due to Extended Spectrum Beta-Lactamase Producer (ESBL) from UTI noted 12/20922 ?- Urine culture on admission is negative ? ?Routine care: ?- Pulm:  RA ?- GI:  Carb-controlled ?- Renal:  Spontaneous voids ?- IVF:  SLIV  ?- Pain:  N/A ?- DVT Prophylaxis:  SCDs in bed, ambulation ?- Activity:  May ambulate ?- Labs:  As above ? ?04/2091, DO ? ?

## 2021-07-19 NOTE — Plan of Care (Signed)

## 2021-07-20 LAB — CBC
HCT: 31.3 % — ABNORMAL LOW (ref 36.0–46.0)
Hemoglobin: 9.8 g/dL — ABNORMAL LOW (ref 12.0–15.0)
MCH: 24.4 pg — ABNORMAL LOW (ref 26.0–34.0)
MCHC: 31.3 g/dL (ref 30.0–36.0)
MCV: 77.9 fL — ABNORMAL LOW (ref 80.0–100.0)
Platelets: 279 10*3/uL (ref 150–400)
RBC: 4.02 MIL/uL (ref 3.87–5.11)
RDW: 13.6 % (ref 11.5–15.5)
WBC: 10.4 10*3/uL (ref 4.0–10.5)
nRBC: 1 % — ABNORMAL HIGH (ref 0.0–0.2)

## 2021-07-20 LAB — GLUCOSE, CAPILLARY
Glucose-Capillary: 110 mg/dL — ABNORMAL HIGH (ref 70–99)
Glucose-Capillary: 117 mg/dL — ABNORMAL HIGH (ref 70–99)
Glucose-Capillary: 76 mg/dL (ref 70–99)
Glucose-Capillary: 77 mg/dL (ref 70–99)
Glucose-Capillary: 96 mg/dL (ref 70–99)

## 2021-07-20 LAB — TYPE AND SCREEN
ABO/RH(D): A POS
Antibody Screen: NEGATIVE

## 2021-07-20 NOTE — Progress Notes (Signed)
FHT Review (Midnight): ? ?Baseline 145bpm, moderate variability, greater than 15x15 accels to 180s, no decelerations. No contractions on toco.  ? ?Steva Ready, DO ? ?

## 2021-07-20 NOTE — Progress Notes (Signed)
Antepartum Progress Note ?  ?Hospital Day#: 6 ?PPROM Date: 07/14/2021 ?  ?Subjective: Patient doing well. Has no concerns today. Still having small volume leakage of clear fluid. Endorses good FM. Denies VB or CTX. ? ?Denies fevers, chills, chest pain, visual changes, SOB, RUQ/epigastric pain, N/V, dysuria, hematuria, or sudden onset/worsening bilateral LE or facial edema. ? ?  ?Objective: ?Vitals:  ? 07/19/21 1945 07/20/21 0025 07/20/21 1749 07/20/21 0827  ?BP: (!) 100/53 103/61 108/64 (!) 107/58  ?Pulse: 74 74 67 73  ?Resp: 18 16 17 16   ?Temp: 98.2 ?F (36.8 ?C) 98.5 ?F (36.9 ?C) 98.2 ?F (36.8 ?C) 97.7 ?F (36.5 ?C)  ?TempSrc: Oral Oral Oral Oral  ?SpO2: 100% 100% 100% 98%  ?Weight:      ?Height:      ?  ?Gen:  NAD, pleasant and cooperative ?Cardio:  RRR ?Pulm:  No increased work of breathing ?Abd:  Soft, gravid, non-distended, non-tender throughout, no rebound/guarding ?Ext:  No bilateral LE edema, no bilateral calf tenderness ?  ?FHT (1016): 140-145bpm, moderate variability, 15x15 accels, no decelerations ?Toco: none ? ?Results for orders placed or performed during the hospital encounter of 07/14/21 (from the past 24 hour(s))  ?Glucose, capillary     Status: Abnormal  ? Collection Time: 07/19/21  7:55 PM  ?Result Value Ref Range  ? Glucose-Capillary 107 (H) 70 - 99 mg/dL  ?Glucose, capillary     Status: None  ? Collection Time: 07/20/21 12:23 AM  ?Result Value Ref Range  ? Glucose-Capillary 76 70 - 99 mg/dL  ? Comment 1 Notify RN   ?Glucose, capillary     Status: None  ? Collection Time: 07/20/21  6:14 AM  ?Result Value Ref Range  ? Glucose-Capillary 77 70 - 99 mg/dL  ? Comment 1 Notify RN   ?CBC     Status: Abnormal  ? Collection Time: 07/20/21  8:17 AM  ?Result Value Ref Range  ? WBC 10.4 4.0 - 10.5 K/uL  ? RBC 4.02 3.87 - 5.11 MIL/uL  ? Hemoglobin 9.8 (L) 12.0 - 15.0 g/dL  ? HCT 31.3 (L) 36.0 - 46.0 %  ? MCV 77.9 (L) 80.0 - 100.0 fL  ? MCH 24.4 (L) 26.0 - 34.0 pg  ? MCHC 31.3 30.0 - 36.0 g/dL  ? RDW 13.6 11.5 -  15.5 %  ? Platelets 279 150 - 400 K/uL  ? nRBC 1.0 (H) 0.0 - 0.2 %  ?Type and screen [facility]     Status: None  ? Collection Time: 07/20/21  8:17 AM  ?Result Value Ref Range  ? ABO/RH(D) A POS   ? Antibody Screen NEG   ? Sample Expiration    ?  07/23/2021,2359 ?Performed at [facility], [address] [place], [place] ?  ?Glucose, capillary     Status: Abnormal  ? Collection Time: 07/20/21 11:44 AM  ?Result Value Ref Range  ? Glucose-Capillary 110 (H) 70 - 99 mg/dL  ?  ?A/P: ?Kathryn Cruz is a 31 y.o. 34 @ [redacted]w[redacted]d admitted for PPROM. ?  ?PPROM ?- Doing well, vital signs stable, no signs/symptoms of chorio ?- FLM: Betamethasone complete 3/29 ?- Antibiotics: Amp/Amox/Azithro for latency x 7 days ?- S/p Magnesium sulfate for fetal neuroprotection x 24h ?- GBS: Positive (through urine) ?- NSTs q shift ?- Presentation: Fetus breech on 4/29 3/31 ?- Fetal growth appropriate on MFM growth 4/31 on 3/27 ?- BPP on 3/31 8/8, fetus still breech -- repeat ordered on 4/7 ?-  S/p NICU consult on 3/27 ?- Consented for C/S and blood products on 3/27 ?- Primary CS scheduled for 4/11 at 1230 ?- Inpatient management until delivery at 34 weeks unless signs/symptoms of chorio develop ?  ?Class A1DM ?- CBG fasting and 2h postprandial ?- SSI ordered ?- BS 70-110 ?  ?Iron deficiency anemia ?- H/H 7.9/25.2 on 07/17/21 ?- S/p IV Venofer on 3/30 ?- Repeat CBC ordered for 4/7 ? ?Contact precautions ?- Per protocol contact precautions x 1 year due to Extended Spectrum Beta-Lactamase Producer (ESBL) from UTI noted 12/20922 ?- Urine culture on admission is negative ? ?Routine care: ?- Pulm:  RA ?- GI:  Carb-controlled ?- Renal:  Spontaneous voids ?- IVF:  SLIV  ?- Pain:  N/A ?- DVT Prophylaxis:  SCDs in bed, ambulation ?- Activity:  May ambulate ?- Labs:  As above ? ?Steva Ready, DO ? ?

## 2021-07-21 LAB — GLUCOSE, CAPILLARY
Glucose-Capillary: 79 mg/dL (ref 70–99)
Glucose-Capillary: 135 mg/dL — ABNORMAL HIGH (ref 70–99)
Glucose-Capillary: 83 mg/dL (ref 70–99)

## 2021-07-21 NOTE — Progress Notes (Signed)
Initial Nutrition Assessment ? ?DOCUMENTATION CODES:  ? ?Not applicable ? ?INTERVENTION:  ?CHO modified diet- this diet does not provide the typical 3 snacks per day - change to gestational diabetic diet if wish to allow pt snacks ? ?NUTRITION DIAGNOSIS:  ? ?Increased nutrient needs related to  (pregnancy and fetal growth requirements) as evidenced by  (32 weeks IUP). ? ?GOAL:  ? ?Patient will meet greater than or equal to 90% of their needs ? ?MONITOR:  ?Weight trends ? ?REASON FOR ASSESSMENT:  ?Antenatal, LOS ?  ?ASSESSMENT:  ?Now 32/6, LOS 7 days for PPROM. pre-preg wt 145 lbs, BMI 28.5. 25 lb weight gain. GDM with prev preg, 1hr GTT 135. c/s planned for 4/11 at 34 weeks ? ?S/p IV Venofer on 3/30 4/2 Hct 31.3% ? ?Diet Order:   ?Diet Order   ? ?       ?  Diet Carb Modified Fluid consistency: Thin; Room service appropriate? Yes  Diet effective now       ?  ? ?  ?  ? ?  ? ? ?EDUCATION NEEDS:  ? ?No education needs have been identified at this time ? ?Skin:  Skin Assessment: Reviewed RN Assessment ? ?Height:  ? ?Ht Readings from Last 1 Encounters:  ?07/14/21 5' (1.524 m)  ? ?Weight:  ? ?Wt Readings from Last 1 Encounters:  ?07/14/21 77.1 kg  ? ? ?Ideal Body Weight:   100 lbs ?BMI:  Body mass index is 33.2 kg/m?. ? ?Estimated Nutritional Needs:  ? ?Kcal:  2400-2500 ? ?Protein:  100-105 ? ?Fluid:  2.5 L ? ? ? ? ?

## 2021-07-21 NOTE — Progress Notes (Signed)
Hospital Day#: 7 ?PPROM Date: 07/14/2021 ?  ?Subjective: Patient doing well. Has no concerns today. Still having small volume leakage of clear fluid. Endorses good FM. Denies VB or CTX. ?  ? She denies abdominal pain or tenderness  ?  ?Vitals:  ? 07/20/21 2308 07/21/21 0427 07/21/21 0824 07/21/21 1234  ?BP: (!) 113/53 101/68 110/70 112/80  ?Pulse: 77 74 73 76  ?Resp: 17 17 16 17   ?Temp: 98.4 ?F (36.9 ?C) 98.4 ?F (36.9 ?C) 98.2 ?F (36.8 ?C) 98.1 ?F (36.7 ?C)  ?TempSrc: Oral Oral Oral Oral  ?SpO2: 98% 98% 97% 100%  ?Weight:      ?Height:      ?  ?Gen:  NAD, pleasant and cooperative ?Cardio:  RRR ?Pulm:  No increased work of breathing ?Abd:  Soft, gravid, non-distended, non-tender throughout, no rebound/guarding ?Ext:  No bilateral LE edema, no bilateral calf tenderness ?NST . Reactive...  no contractions on toco ?   ?Results for orders placed or performed during the hospital encounter of 07/14/21 (from the past 24 hour(s))  ?Glucose, capillary     Status: None  ? Collection Time: 07/20/21  4:51 PM  ?Result Value Ref Range  ? Glucose-Capillary 96 70 - 99 mg/dL  ?Glucose, capillary     Status: Abnormal  ? Collection Time: 07/20/21 10:01 PM  ?Result Value Ref Range  ? Glucose-Capillary 117 (H) 70 - 99 mg/dL  ?Glucose, capillary     Status: None  ? Collection Time: 07/21/21  8:32 AM  ?Result Value Ref Range  ? Glucose-Capillary 79 70 - 99 mg/dL  ?   ?A/P: ?Kathryn Cruz is a 31 y.o. 34 @ [redacted]w[redacted]d admitted for PPROM. ?  ?PPROM ?- Doing well, vital signs stable, no signs/symptoms of chorio ?- FLM: Betamethasone complete 3/29 ?- Antibiotics: Amp/Amox/Azithro for latency x 7 days ?- S/p Magnesium sulfate for fetal neuroprotection x 24h ?- GBS: Positive (through urine) ?- NSTs q shift ?- Presentation: Fetus breech on 4/29 3/31 ?- Fetal growth appropriate on MFM growth 4/31 on 3/27 ?- BPP on 3/31 8/8, fetus still breech -- repeat ordered on 4/7 ?- S/p NICU consult on 3/27 ?- Consented for C/S and blood products on  3/27 ?- Primary CS scheduled for 4/11 at 1230 ?- Inpatient management until delivery at 34 weeks unless signs/symptoms of chorio develop ?  ?Class A1DM ?- CBG fasting and 2h postprandial ?- SSI ordered ?- BS 70-110 ?  ?Iron deficiency anemia ?- H/H 7.9/25.2 on 07/17/21 ?- S/p IV Venofer on 3/30 ?- Repeat CBC ordered for 4/7 ?  ?Contact precautions ?- Per protocol contact precautions x 1 year due to Extended Spectrum Beta-Lactamase Producer (ESBL) from UTI noted 03/2021 ?- Urine culture on admission is negative ?  ?Routine care: ?- Pulm:  RA ?- GI:  Carb-controlled ?- Renal:  Spontaneous voids ?- IVF:  SLIV  ?- Pain:  N/A ?- DVT Prophylaxis:  SCDs in bed, ambulation ?- Activity:  May ambulate ?

## 2021-07-22 LAB — GLUCOSE, CAPILLARY
Glucose-Capillary: 81 mg/dL (ref 70–99)
Glucose-Capillary: 112 mg/dL — ABNORMAL HIGH (ref 70–99)
Glucose-Capillary: 100 mg/dL — ABNORMAL HIGH (ref 70–99)

## 2021-07-22 NOTE — Progress Notes (Signed)
Antepartum Progress Note ?  ?Hospital Day#: 8 ?PPROM Date: 07/14/2021 ?  ?Subjective: Patient doing well. Has no concerns today. Still having small volume leakage of clear fluid. Reports some hip pain. Has not ambulated very much but some in the room. Reports occasional contractions. Endorses good FM. Denies VB or regular CTX. ? ?Denies fevers, chills, chest pain, visual changes, SOB, RUQ/epigastric pain, N/V, dysuria, hematuria, or sudden onset/worsening bilateral LE or facial edema. ? ?  ?Objective: ?Vitals:  ? 07/21/21 1550 07/21/21 1942 07/21/21 2215 07/22/21 0348  ?BP: 120/70 115/75 115/60 106/66  ?Pulse: 93 86 75 71  ?Resp: 16 16 17 18   ?Temp: 98.1 ?F (36.7 ?C) 98.2 ?F (36.8 ?C) 98.4 ?F (36.9 ?C) 98.2 ?F (36.8 ?C)  ?TempSrc: Oral Oral Oral Oral  ?SpO2: 98% 98% 98% 98%  ?Weight:      ?Height:      ?  ?Gen:  NAD, pleasant and cooperative ?Cardio:  RRR ?Pulm:  No increased work of breathing ?Abd:  Soft, gravid, non-distended, non-tender throughout, no rebound/guarding ?Ext:  No bilateral LE edema, no bilateral calf tenderness ?  ?FHT (1948 last night): 145bpm, moderate variability, 15x15 accels, no decelerations ?Toco: none ? ?Results for orders placed or performed during the hospital encounter of 07/14/21 (from the past 24 hour(s))  ?Glucose, capillary     Status: None  ? Collection Time: 07/21/21  8:32 AM  ?Result Value Ref Range  ? Glucose-Capillary 79 70 - 99 mg/dL  ?Glucose, capillary     Status: Abnormal  ? Collection Time: 07/21/21  3:48 PM  ?Result Value Ref Range  ? Glucose-Capillary 135 (H) 70 - 99 mg/dL  ?Glucose, capillary     Status: None  ? Collection Time: 07/21/21 10:14 PM  ?Result Value Ref Range  ? Glucose-Capillary 83 70 - 99 mg/dL  ?Glucose, capillary     Status: None  ? Collection Time: 07/22/21  5:52 AM  ?Result Value Ref Range  ? Glucose-Capillary 81 70 - 99 mg/dL  ?  ?A/P: ?Kathryn Cruz is a 31 y.o. [id number] @ [redacted]w[redacted]d admitted for PPROM. ?  ?PPROM ?- Doing well, vital signs stable,  no signs/symptoms of chorio ?- FLM: Betamethasone complete 3/29 ?- Antibiotics: S/p Amp/Amox/Azithro for latency x 7 days ?- S/p Magnesium sulfate for fetal neuroprotection x 24h ?- GBS: Positive (through urine) ?- NSTs q shift ?- Presentation: Fetus breech on [place] 3/31 ?- Fetal growth appropriate on MFM growth [place] on 3/27 ?- BPP on 3/31 8/8, fetus still breech -- repeat ordered on 4/7 ?- S/p NICU consult on 3/27 ?- Consented for C/S and blood products on 3/27 ?- Primary CS scheduled for 4/11 at 1230 ?- Inpatient management until delivery at 34 weeks unless signs/symptoms of chorio develop ?  ?Class A1DM ?- CBG fasting and 2h postprandial ?- SSI ordered ?- BS 70-135 ?  ?Iron deficiency anemia ?- H/H 7.9/25.2 on 07/17/21 ?- S/p IV Venofer on 3/30 ?- Repeat CBC ordered for 4/7 ? ?Contact precautions ?- Per protocol contact precautions x 1 year due to Extended Spectrum Beta-Lactamase Producer (ESBL) from UTI noted 12/20922 ?- Urine culture on admission is negative ? ?Routine care: ?- Pulm:  RA ?- GI:  Carb-controlled ?- Renal:  Spontaneous voids ?- IVF:  SLIV  ?- Pain:  N/A ?- DVT Prophylaxis:  SCDs in bed, ambulation ?- Activity:  May ambulate ?- Labs:  As above ? ?Drema Dallas, DO ? ?

## 2021-07-23 LAB — TYPE AND SCREEN
ABO/RH(D): A POS
Antibody Screen: NEGATIVE

## 2021-07-23 LAB — CBC
HCT: 30.8 % — ABNORMAL LOW (ref 36.0–46.0)
Hemoglobin: 9.6 g/dL — ABNORMAL LOW (ref 12.0–15.0)
MCH: 24.4 pg — ABNORMAL LOW (ref 26.0–34.0)
MCHC: 31.2 g/dL (ref 30.0–36.0)
MCV: 78.4 fL — ABNORMAL LOW (ref 80.0–100.0)
Platelets: 277 10*3/uL (ref 150–400)
RBC: 3.93 MIL/uL (ref 3.87–5.11)
RDW: 14.2 % (ref 11.5–15.5)
WBC: 9 10*3/uL (ref 4.0–10.5)
nRBC: 0.2 % (ref 0.0–0.2)

## 2021-07-23 LAB — GLUCOSE, CAPILLARY
Glucose-Capillary: 90 mg/dL (ref 70–99)
Glucose-Capillary: 144 mg/dL — ABNORMAL HIGH (ref 70–99)
Glucose-Capillary: 112 mg/dL — ABNORMAL HIGH (ref 70–99)
Glucose-Capillary: 126 mg/dL — ABNORMAL HIGH (ref 70–99)

## 2021-07-23 NOTE — Progress Notes (Addendum)
Antepartum Progress Note ?  ?Hospital Day#: 9 ?PPROM Date: 07/14/2021 ?  ?Subjective: Patient doing well. Has no concerns today. Still having small volume leakage of clear fluid. Endorses good FM. Denies VB or regular CTX. ? ?Denies fevers, chills, chest pain, visual changes, SOB, RUQ/epigastric pain, N/V, dysuria, hematuria, or sudden onset/worsening bilateral LE or facial edema. ? ?  ?Objective: ?Vitals:  ? 07/22/21 1607 07/22/21 1940 07/22/21 2340 07/23/21 0350  ?BP: 121/70 114/75 104/60 103/65  ?Pulse: (!) 104 98 80 60  ?Resp: 17 18 18 16   ?Temp: 98 ?F (36.7 ?C) 98.2 ?F (36.8 ?C) 98 ?F (36.7 ?C) 98 ?F (36.7 ?C)  ?TempSrc: Oral Oral Oral Oral  ?SpO2: 98% 98% 97% 97%  ?Weight:      ?Height:      ?  ?Gen:  NAD, pleasant and cooperative ?Cardio:  RRR ?Pulm:  CTAB, no wheezes/rales/rhonchi ?Abd:  Soft, gravid, non-distended, non-tender throughout, no rebound/guarding ?Ext:  No bilateral LE edema, no bilateral calf tenderness ?  ?FHT (2128 last night): 135-145bpm, moderate variability, 15x15 accels, no decelerations ?Toco: none ? ?Results for orders placed or performed during the hospital encounter of 07/14/21 (from the past 24 hour(s))  ?Glucose, capillary     Status: Abnormal  ? Collection Time: 07/22/21 11:33 AM  ?Result Value Ref Range  ? Glucose-Capillary 112 (H) 70 - 99 mg/dL  ?Glucose, capillary     Status: Abnormal  ? Collection Time: 07/22/21  5:53 PM  ?Result Value Ref Range  ? Glucose-Capillary 100 (H) 70 - 99 mg/dL  ?Glucose, capillary     Status: None  ? Collection Time: 07/23/21  5:53 AM  ?Result Value Ref Range  ? Glucose-Capillary 90 70 - 99 mg/dL  ?  ?A/P: ?Kathryn Cruz is a 31 y.o. 34 @ [redacted]w[redacted]d admitted for PPROM. ?  ?PPROM ?- Doing well, vital signs stable, no signs/symptoms of chorio ?- FLM: Betamethasone complete 3/29 ?- Antibiotics: S/p Amp/Amox/Azithro for latency x 7 days ?- S/p Magnesium sulfate for fetal neuroprotection x 24h ?- GBS: Positive (through urine) ?- NSTs q shift ?-  Presentation: Fetus breech on 4/29 3/31 ?- Fetal growth appropriate on MFM growth 4/31 on 3/27 ?- BPP on 3/31 8/8, fetus still breech -- repeat ordered on 4/7 ?- S/p NICU consult on 3/27 ?- Consented for C/S and blood products on 3/27 ?- Primary CS scheduled for 4/11 at 1230 ?- Inpatient management until delivery at 34 weeks unless signs/symptoms of chorio develop ?  ?Class A1DM ?- CBG fasting and 2h postprandial ?- SSI ordered ?- BS 70-112 ?  ?Iron deficiency anemia ?- H/H 7.9/25.2 on 07/17/21 ?- S/p IV Venofer on 3/30 ?- Repeat CBC ordered for 4/7 ? ?Contact precautions ?- Per protocol contact precautions x 1 year due to Extended Spectrum Beta-Lactamase Producer (ESBL) from UTI noted 12/20922 ?- Urine culture on admission is negative ? ?Routine care: ?- Pulm:  RA ?- GI:  Carb-controlled ?- Renal:  Spontaneous voids ?- IVF:  SLIV  ?- Pain:  N/A ?- DVT Prophylaxis:  SCDs in bed, ambulation ?- Activity:  May ambulate ?- Labs:  As above ? ?04/2091, DO ? ?

## 2021-07-23 NOTE — Progress Notes (Signed)
Patient states last meal was at 1930. Will assess 2hrpp and bedtime CBG near 2130.  ?

## 2021-07-23 NOTE — Progress Notes (Signed)
Arrived to patient's room. Told by visitor that she just got in the shower. Secure chatted nurse to notify to re-consult IV team when patient ready for PIV start. Tomasita Morrow, RN VAST ?

## 2021-07-24 ENCOUNTER — Inpatient Hospital Stay (HOSPITAL_COMMUNITY): Payer: BC Managed Care – PPO | Admitting: Anesthesiology

## 2021-07-24 ENCOUNTER — Encounter (HOSPITAL_COMMUNITY): Payer: Self-pay | Admitting: Anesthesiology

## 2021-07-24 ENCOUNTER — Other Ambulatory Visit: Payer: Self-pay

## 2021-07-24 ENCOUNTER — Encounter (HOSPITAL_COMMUNITY): Payer: Self-pay | Admitting: Obstetrics and Gynecology

## 2021-07-24 ENCOUNTER — Encounter (HOSPITAL_COMMUNITY)
Admission: AD | Disposition: A | Discharge status: Home / Self Care | Payer: Self-pay | Source: Home / Self Care | Attending: Obstetrics and Gynecology

## 2021-07-24 LAB — GLUCOSE, CAPILLARY
Glucose-Capillary: 126 mg/dL — ABNORMAL HIGH (ref 70–99)
Glucose-Capillary: 92 mg/dL (ref 70–99)
Glucose-Capillary: 133 mg/dL — ABNORMAL HIGH (ref 70–99)
Glucose-Capillary: 165 mg/dL — ABNORMAL HIGH (ref 70–99)

## 2021-07-24 SURGERY — Surgical Case
Anesthesia: Spinal | Wound class: Clean Contaminated

## 2021-07-24 MED ORDER — DIPHENHYDRAMINE HCL 50 MG/ML IJ SOLN: 12.5000 mg | INTRAMUSCULAR | Status: DC | PRN

## 2021-07-24 MED ORDER — DEXAMETHASONE SODIUM PHOSPHATE 10 MG/ML IJ SOLN
INTRAMUSCULAR | Status: DC | PRN
  Administered 2021-07-24: 10 mg via INTRAVENOUS

## 2021-07-24 MED ORDER — KETOROLAC TROMETHAMINE 30 MG/ML IJ SOLN
INTRAMUSCULAR | Status: AC
  Filled 2021-07-24: qty 1

## 2021-07-24 MED ORDER — MEPERIDINE HCL 25 MG/ML IJ SOLN: 6.2500 mg | INTRAMUSCULAR | Status: DC | PRN

## 2021-07-24 MED ORDER — IBUPROFEN 600 MG PO TABS
600.0000 mg | ORAL_TABLET | Freq: Four times a day (QID) | ORAL | Status: DC
  Administered 2021-07-25 – 2021-07-27 (×8): 600 mg via ORAL
  Filled 2021-07-24 (×8): qty 1

## 2021-07-24 MED ORDER — SOD CITRATE-CITRIC ACID 500-334 MG/5ML PO SOLN
ORAL | Status: AC
  Administered 2021-07-24: 30 mL
  Filled 2021-07-24: qty 30

## 2021-07-24 MED ORDER — ONDANSETRON HCL 4 MG/2ML IJ SOLN
INTRAMUSCULAR | Status: AC
  Filled 2021-07-24: qty 2

## 2021-07-24 MED ORDER — MENTHOL 3 MG MT LOZG: 1.0000 | LOZENGE | OROMUCOSAL | Status: DC | PRN

## 2021-07-24 MED ORDER — STERILE WATER FOR IRRIGATION IR SOLN
Status: DC | PRN
  Administered 2021-07-24: 1000 mL

## 2021-07-24 MED ORDER — OXYTOCIN-SODIUM CHLORIDE 30-0.9 UT/500ML-% IV SOLN: 2.5000 [IU]/h | INTRAVENOUS | Status: AC

## 2021-07-24 MED ORDER — NALOXONE HCL 4 MG/10ML IJ SOLN
1.0000 ug/kg/h | INTRAVENOUS | Status: DC | PRN
  Filled 2021-07-24: qty 5

## 2021-07-24 MED ORDER — OXYCODONE HCL 5 MG PO TABS
5.0000 mg | ORAL_TABLET | ORAL | Status: DC | PRN
  Administered 2021-07-27 (×2): 5 mg via ORAL
  Filled 2021-07-24 (×2): qty 1

## 2021-07-24 MED ORDER — KETOROLAC TROMETHAMINE 30 MG/ML IJ SOLN: 30.0000 mg | Freq: Four times a day (QID) | INTRAMUSCULAR | Status: DC | PRN

## 2021-07-24 MED ORDER — SCOPOLAMINE 1 MG/3DAYS TD PT72
1.0000 | MEDICATED_PATCH | Freq: Once | TRANSDERMAL | Status: AC
  Administered 2021-07-24: 1.5 mg via TRANSDERMAL

## 2021-07-24 MED ORDER — CEFAZOLIN SODIUM-DEXTROSE 2-4 GM/100ML-% IV SOLN
INTRAVENOUS | Status: AC
  Filled 2021-07-24: qty 100

## 2021-07-24 MED ORDER — ZOLPIDEM TARTRATE 5 MG PO TABS
5.0000 mg | ORAL_TABLET | Freq: Every evening | ORAL | Status: DC | PRN
Start: 2021-07-24 — End: 2021-07-27

## 2021-07-24 MED ORDER — OXYTOCIN-SODIUM CHLORIDE 30-0.9 UT/500ML-% IV SOLN
INTRAVENOUS | Status: AC
  Filled 2021-07-24: qty 500

## 2021-07-24 MED ORDER — ALBUMIN HUMAN 5 % IV SOLN: INTRAVENOUS | Status: DC | PRN

## 2021-07-24 MED ORDER — ACETAMINOPHEN 325 MG PO TABS: 325.0000 mg | ORAL_TABLET | ORAL | Status: DC | PRN

## 2021-07-24 MED ORDER — TRANEXAMIC ACID 1000 MG/10ML IV SOLN
INTRAVENOUS | Status: DC | PRN
  Administered 2021-07-24: 1000 mg via INTRAVENOUS

## 2021-07-24 MED ORDER — ACETAMINOPHEN 10 MG/ML IV SOLN
1000.0000 mg | Freq: Once | INTRAVENOUS | Status: DC | PRN
  Administered 2021-07-24: 1000 mg via INTRAVENOUS

## 2021-07-24 MED ORDER — SIMETHICONE 80 MG PO CHEW: 80.0000 mg | CHEWABLE_TABLET | ORAL | Status: DC | PRN

## 2021-07-24 MED ORDER — WITCH HAZEL-GLYCERIN EX PADS: 1.0000 "application " | MEDICATED_PAD | CUTANEOUS | Status: DC | PRN

## 2021-07-24 MED ORDER — SIMETHICONE 80 MG PO CHEW
80.0000 mg | CHEWABLE_TABLET | Freq: Three times a day (TID) | ORAL | Status: DC
  Administered 2021-07-25 – 2021-07-27 (×7): 80 mg via ORAL
  Filled 2021-07-24 (×7): qty 1

## 2021-07-24 MED ORDER — LACTATED RINGERS IV SOLN: INTRAVENOUS | Status: DC

## 2021-07-24 MED ORDER — MORPHINE SULFATE (PF) 0.5 MG/ML IJ SOLN
INTRAMUSCULAR | Status: AC
  Filled 2021-07-24: qty 10

## 2021-07-24 MED ORDER — SCOPOLAMINE 1 MG/3DAYS TD PT72
MEDICATED_PATCH | TRANSDERMAL | Status: AC
  Filled 2021-07-24: qty 1

## 2021-07-24 MED ORDER — TERBUTALINE SULFATE 1 MG/ML IJ SOLN
0.2500 mg | Freq: Once | INTRAMUSCULAR | Status: AC
  Administered 2021-07-24: 0.25 mg via SUBCUTANEOUS
  Filled 2021-07-24: qty 1

## 2021-07-24 MED ORDER — SODIUM CHLORIDE 0.9 % IR SOLN
Status: DC | PRN
  Administered 2021-07-24: 1
  Administered 2021-07-24: 1000 mL

## 2021-07-24 MED ORDER — KETOROLAC TROMETHAMINE 30 MG/ML IJ SOLN
30.0000 mg | Freq: Four times a day (QID) | INTRAMUSCULAR | Status: DC | PRN
  Administered 2021-07-24: 30 mg via INTRAMUSCULAR

## 2021-07-24 MED ORDER — FENTANYL CITRATE (PF) 100 MCG/2ML IJ SOLN: 25.0000 ug | INTRAMUSCULAR | Status: DC | PRN

## 2021-07-24 MED ORDER — BUPIVACAINE IN DEXTROSE 0.75-8.25 % IT SOLN
INTRATHECAL | Status: DC | PRN
  Administered 2021-07-24: 1.6 mL via INTRATHECAL

## 2021-07-24 MED ORDER — DIBUCAINE (PERIANAL) 1 % EX OINT: 1.0000 "application " | TOPICAL_OINTMENT | CUTANEOUS | Status: DC | PRN

## 2021-07-24 MED ORDER — PHENYLEPHRINE HCL-NACL 20-0.9 MG/250ML-% IV SOLN
INTRAVENOUS | Status: DC | PRN
  Administered 2021-07-24: 60 ug/min via INTRAVENOUS

## 2021-07-24 MED ORDER — COCONUT OIL OIL: 1.0000 "application " | TOPICAL_OIL | Status: DC | PRN

## 2021-07-24 MED ORDER — PHENYLEPHRINE HCL-NACL 20-0.9 MG/250ML-% IV SOLN
INTRAVENOUS | Status: AC
  Filled 2021-07-24: qty 250

## 2021-07-24 MED ORDER — KETOROLAC TROMETHAMINE 30 MG/ML IJ SOLN
30.0000 mg | Freq: Four times a day (QID) | INTRAMUSCULAR | Status: AC
  Administered 2021-07-25 (×3): 30 mg via INTRAVENOUS
  Filled 2021-07-24 (×3): qty 1

## 2021-07-24 MED ORDER — SODIUM CHLORIDE 0.9 % IV SOLN
3.0000 g | Freq: Four times a day (QID) | INTRAVENOUS | Status: AC
  Administered 2021-07-24 – 2021-07-25 (×4): 3 g via INTRAVENOUS
  Filled 2021-07-24 (×4): qty 8

## 2021-07-24 MED ORDER — OXYTOCIN-SODIUM CHLORIDE 30-0.9 UT/500ML-% IV SOLN
INTRAVENOUS | Status: DC | PRN
Start: 2021-07-24 — End: 2021-07-24
  Administered 2021-07-24: 30 [IU] via INTRAVENOUS

## 2021-07-24 MED ORDER — ONDANSETRON HCL 4 MG/2ML IJ SOLN
INTRAMUSCULAR | Status: DC | PRN
  Administered 2021-07-24: 4 mg via INTRAVENOUS

## 2021-07-24 MED ORDER — FENTANYL CITRATE (PF) 100 MCG/2ML IJ SOLN
INTRAMUSCULAR | Status: AC
Start: 2021-07-24 — End: ?
  Filled 2021-07-24: qty 2

## 2021-07-24 MED ORDER — ONDANSETRON HCL 4 MG/2ML IJ SOLN: 4.0000 mg | Freq: Three times a day (TID) | INTRAMUSCULAR | Status: DC | PRN

## 2021-07-24 MED ORDER — TERBUTALINE SULFATE 1 MG/ML IJ SOLN
INTRAMUSCULAR | Status: AC
Start: 2021-07-24 — End: 2021-07-24
  Filled 2021-07-24: qty 1

## 2021-07-24 MED ORDER — ACETAMINOPHEN 500 MG PO TABS
1000.0000 mg | ORAL_TABLET | Freq: Four times a day (QID) | ORAL | Status: DC
  Administered 2021-07-25 – 2021-07-26 (×5): 1000 mg via ORAL
  Filled 2021-07-24 (×5): qty 2

## 2021-07-24 MED ORDER — NALOXONE HCL 0.4 MG/ML IJ SOLN: 0.4000 mg | INTRAMUSCULAR | Status: DC | PRN

## 2021-07-24 MED ORDER — DIPHENHYDRAMINE HCL 25 MG PO CAPS: 25.0000 mg | ORAL_CAPSULE | Freq: Four times a day (QID) | ORAL | Status: DC | PRN

## 2021-07-24 MED ORDER — OXYCODONE HCL 5 MG/5ML PO SOLN: 5.0000 mg | Freq: Once | ORAL | Status: DC | PRN

## 2021-07-24 MED ORDER — PRENATAL MULTIVITAMIN CH
1.0000 | ORAL_TABLET | Freq: Every day | ORAL | Status: DC
  Administered 2021-07-25 – 2021-07-26 (×2): 1 via ORAL
  Filled 2021-07-24 (×2): qty 1

## 2021-07-24 MED ORDER — SENNOSIDES-DOCUSATE SODIUM 8.6-50 MG PO TABS
2.0000 | ORAL_TABLET | Freq: Every day | ORAL | Status: DC
  Administered 2021-07-25 – 2021-07-27 (×3): 2 via ORAL
  Filled 2021-07-24 (×3): qty 2

## 2021-07-24 MED ORDER — MORPHINE SULFATE (PF) 2 MG/ML IV SOLN: 1.0000 mg | INTRAVENOUS | Status: DC | PRN

## 2021-07-24 MED ORDER — MORPHINE SULFATE (PF) 0.5 MG/ML IJ SOLN
INTRAMUSCULAR | Status: DC | PRN
  Administered 2021-07-24: .15 mg via INTRATHECAL

## 2021-07-24 MED ORDER — SODIUM CHLORIDE 0.9% FLUSH: 3.0000 mL | INTRAVENOUS | Status: DC | PRN

## 2021-07-24 MED ORDER — ACETAMINOPHEN 160 MG/5ML PO SOLN: 325.0000 mg | ORAL | Status: DC | PRN

## 2021-07-24 MED ORDER — LACTATED RINGERS IV BOLUS
1000.0000 mL | Freq: Once | INTRAVENOUS | Status: AC
  Administered 2021-07-24: 1000 mL via INTRAVENOUS

## 2021-07-24 MED ORDER — DEXAMETHASONE SODIUM PHOSPHATE 10 MG/ML IJ SOLN
INTRAMUSCULAR | Status: AC
Start: 2021-07-24 — End: ?
  Filled 2021-07-24: qty 1

## 2021-07-24 MED ORDER — ACETAMINOPHEN 10 MG/ML IV SOLN: INTRAVENOUS | Status: DC | PRN

## 2021-07-24 MED ORDER — DIPHENHYDRAMINE HCL 25 MG PO CAPS: 25.0000 mg | ORAL_CAPSULE | ORAL | Status: DC | PRN

## 2021-07-24 MED ORDER — FENTANYL CITRATE (PF) 100 MCG/2ML IJ SOLN
INTRAMUSCULAR | Status: DC | PRN
  Administered 2021-07-24: 15 ug via INTRATHECAL

## 2021-07-24 MED ORDER — ACETAMINOPHEN 10 MG/ML IV SOLN
INTRAVENOUS | Status: AC
  Filled 2021-07-24: qty 100

## 2021-07-24 MED ORDER — FERROUS SULFATE 325 (65 FE) MG PO TABS
325.0000 mg | ORAL_TABLET | Freq: Two times a day (BID) | ORAL | Status: DC
  Administered 2021-07-25 – 2021-07-27 (×6): 325 mg via ORAL
  Filled 2021-07-24 (×6): qty 1

## 2021-07-24 MED ORDER — OXYCODONE HCL 5 MG PO TABS: 5.0000 mg | ORAL_TABLET | Freq: Once | ORAL | Status: DC | PRN

## 2021-07-24 SURGICAL SUPPLY — 44 items
APL PRP STRL LF DISP 70% ISPRP (MISCELLANEOUS) ×2
APL SKNCLS STERI-STRIP NONHPOA (GAUZE/BANDAGES/DRESSINGS) ×1
BENZOIN TINCTURE PRP APPL 2/3 (GAUZE/BANDAGES/DRESSINGS) ×2 IMPLANT
CHLORAPREP W/TINT 26 (MISCELLANEOUS) ×4 IMPLANT
CLAMP CORD UMBIL (MISCELLANEOUS) ×2 IMPLANT
CLOSURE STERI STRIP 1/2 X4 (GAUZE/BANDAGES/DRESSINGS) ×1 IMPLANT
CLOTH BEACON ORANGE TIMEOUT ST (SAFETY) ×2 IMPLANT
DRAPE C SECTION CLR SCREEN (DRAPES) ×2 IMPLANT
DRSG OPSITE POSTOP 4X10 (GAUZE/BANDAGES/DRESSINGS) ×2 IMPLANT
ELECT REM PT RETURN 9FT ADLT (ELECTROSURGICAL) ×2
ELECTRODE REM PT RTRN 9FT ADLT (ELECTROSURGICAL) ×1 IMPLANT
EXTRACTOR VACUUM KIWI (MISCELLANEOUS) IMPLANT
GAUZE SPONGE 4X4 12PLY STRL (GAUZE/BANDAGES/DRESSINGS) ×2 IMPLANT
GLOVE BIO SURGEON STRL SZ 6.5 (GLOVE) ×2 IMPLANT
GLOVE BIOGEL PI IND STRL 7.0 (GLOVE) ×2 IMPLANT
GLOVE BIOGEL PI INDICATOR 7.0 (GLOVE) ×2
GLOVE SURG SS PI 6.5 STRL IVOR (GLOVE) ×2 IMPLANT
GOWN STRL REUS W/ TWL LRG LVL3 (GOWN DISPOSABLE) ×3 IMPLANT
GOWN STRL REUS W/TWL LRG LVL3 (GOWN DISPOSABLE) ×6
KIT ABG SYR 3ML LUER SLIP (SYRINGE) IMPLANT
NDL HYPO 25X5/8 SAFETYGLIDE (NEEDLE) IMPLANT
NEEDLE HYPO 25X5/8 SAFETYGLIDE (NEEDLE) IMPLANT
NS IRRIG 1000ML POUR BTL (IV SOLUTION) ×2 IMPLANT
PACK C SECTION WH (CUSTOM PROCEDURE TRAY) ×2 IMPLANT
PAD ABD 7.5X8 STRL (GAUZE/BANDAGES/DRESSINGS) ×1 IMPLANT
PAD OB MATERNITY 4.3X12.25 (PERSONAL CARE ITEMS) ×2 IMPLANT
PENCIL SMOKE EVAC W/HOLSTER (ELECTROSURGICAL) ×2 IMPLANT
RTRCTR C-SECT PINK 25CM LRG (MISCELLANEOUS) ×2 IMPLANT
SPONGE T-LAP 18X18 ~~LOC~~+RFID (SPONGE) ×2 IMPLANT
STRIP CLOSURE SKIN 1/2X4 (GAUZE/BANDAGES/DRESSINGS) ×2 IMPLANT
SUT MNCRL 0 VIOLET CTX 36 (SUTURE) ×2 IMPLANT
SUT MNCRL+ AB 3-0 CT1 36 (SUTURE) ×2 IMPLANT
SUT MON AB-0 CT1 36 (SUTURE) ×1 IMPLANT
SUT MONOCRYL 0 CTX 36 (SUTURE) ×6
SUT MONOCRYL AB 3-0 CT1 36IN (SUTURE) ×4
SUT PDS AB 0 CTX 36 PDP370T (SUTURE) ×5 IMPLANT
SUT PDS AB 0 CTX 60 (SUTURE) ×1 IMPLANT
SUT PLAIN 0 NONE (SUTURE) IMPLANT
SUT VIC AB 2-0 CT1 27 (SUTURE)
SUT VIC AB 2-0 CT1 TAPERPNT 27 (SUTURE) IMPLANT
SUT VIC AB 4-0 KS 27 (SUTURE) ×2 IMPLANT
TOWEL OR 17X24 6PK STRL BLUE (TOWEL DISPOSABLE) ×4 IMPLANT
TRAY FOLEY W/BAG SLVR 14FR LF (SET/KITS/TRAYS/PACK) ×2 IMPLANT
WATER STERILE IRR 1000ML POUR (IV SOLUTION) ×2 IMPLANT

## 2021-07-24 NOTE — Anesthesia Procedure Notes (Signed)
Spinal ? ?Start time: 07/24/2021 7:26 PM ?End time: 07/24/2021 7:28 PM ?Reason for block: surgical anesthesia ?Staffing ?Performed: anesthesiologist  ?Anesthesiologist: Shelton Silvas, MD ?Preanesthetic Checklist ?Completed: patient identified, IV checked, site marked, risks and benefits discussed, surgical consent, monitors and equipment checked, pre-op evaluation and timeout performed ?Spinal Block ?Patient position: sitting ?Prep: DuraPrep and site prepped and draped ?Location: L3-4 ?Injection technique: single-shot ?Needle ?Needle type: Pencan  ?Needle gauge: 24 G ?Needle length: 10 cm ?Needle insertion depth: 10 cm ?Additional Notes ?Patient tolerated well. No immediate complications. ? ?Functioning IV was confirmed and monitors were applied. Sterile prep and drape, including hand hygiene and sterile gloves were used. The patient was positioned and the back was prepped. The skin was anesthetized with lidocaine. Free flow of clear CSF was obtained prior to injecting local anesthetic into the CSF. The spinal needle aspirated freely following injection. The needle was carefully withdrawn. The patient tolerated the procedure well. ? ? ? ? ?

## 2021-07-24 NOTE — Progress Notes (Signed)
Per RN, pt is resting and denies feeling contractions. NST reactive. Pt remains on OB Specialty. CNM to report to Dr. Delora Fuel to develop next stage in plan of care.  ? ?Burman Foster, MSN, CNM ?07/24/2021 7:23 AM ? ?

## 2021-07-24 NOTE — Transfer of Care (Signed)
Immediate Anesthesia Transfer of Care Note ? ?Patient: Kathryn Cruz ? ?Procedure(s) Performed: CESAREAN SECTION ? ?Patient Location: PACU ? ?Anesthesia Type:Spinal ? ?Level of Consciousness: awake, alert , oriented and patient cooperative ? ?Airway & Oxygen Therapy: Patient Spontanous Breathing ? ?Post-op Assessment: Report given to RN and Post -op Vital signs reviewed and stable ? ?Post vital signs: Reviewed and stable ? ?Last Vitals:  ?Vitals Value Taken Time  ?BP    ?Temp    ?Pulse    ?Resp    ?SpO2    ? ? ?Last Pain:  ?Vitals:  ? 07/24/21 1910  ?TempSrc: Oral  ?PainSc:   ?   ? ?Patients Stated Pain Goal: 3 (07/21/21 1939) ? ?Complications: No notable events documented. ?

## 2021-07-24 NOTE — Progress Notes (Addendum)
Call received from RN. Pt reports increased loss of fluid and increase in frequency and intensity of contractions. EFM applied and IV fluid bolus ordered.  ? ?Rhea Pink, MSN, CNM ?07/24/2021 2:22 AM ? ?

## 2021-07-24 NOTE — Op Note (Signed)
Pre Op Dx:   ?1. Single live IUP at [redacted]w[redacted]d ?2. Preterm Labor ?3. PPROM ?4. Fetal malpresentation (breech) ? ?Post Op Dx:  ?Same as pre-operative diagnoses ? ?Procedure:  Low Transverse Cesarean Section ? ?Surgeon:  Dr. Wellington Hampshire. Delora Fuel ?Assistants:  Burman Foster, CNM ?Anesthesia:  Spinal ? ?EBL:  683cc  ?IVF:  1000cc crystalloid, 250cc Albumin ?UOP:  100cc clear yellow urine ? ?Drains:  Foley catheter  ?Specimen removed:  Placenta - sent to pathology  Umbilical artery cord gases ?Device(s) implanted:  None ?Case Type:  Clean-contaminated ?Findings: Normal-appearing uterus, bilateral fallopian tubes, and ovaries. Fetus single footling breech position, clear amniotic fluid.  ?Complications: None ? ?Indications:  31 y.o. [id number] admitted for PPROM began actively laboring and was found to be 8cm and confirmed footling breech on [place] by Dr. Kennon Rounds. ? ?Procedure:  After informed consent was obtained, the patient was brought to the operating room.  Following administration of spinal anesthesia, the patient was positioned in dorsal supine position with a leftward tilt and was prepped and draped in sterile fashion.  A preoperative time-out was performed.  The abdomen was entered in layers through a pfannenstiel incision and an Chiropodist was placed.  A low transverse hysterotomy was created sharply to the level of the membranes, then extended bluntly.  The fetus was delivered from breech presentation onto the field.  Bulb suctioning was performed.  The cord was doubly clamped and cut after a 60 second pause.  The newborn was passed to the awaiting NICU team.  The placenta was delivered.  The uterus was swept free of clots and debris and closed in a running locked fashion with 0-Monocryl. A second imbricating layer was used to close the uterus using 0-Monocryl. Hemostasis was verified.  The abdomen was irrigated with warmed saline and cleared of clots. It was noted that there was bleeding coming from the fundus of the  uterus where there was an abrasion superficially in the myometrium from previous exteriorizing. This area at the fundus was made hemostatic using multiple figure-of-eight sutures with 0-Monocryl.  Subfascial spaces were inspected and hemostasis assured.  The fascia was closed in a running fashion with 0-PDS.  The subcutaneous tissues were irrigated and hemostasis assured.  The subcutaneous tissues were closed with 3-0 Monocryl.  The skin was closed with 4-0 Vicryl.  A sterile bandage was applied.  The patient was transferred to PACU.  All needle, sponge, and instrument counts were correct at the end of the case.   ?Disposition:  PACU ? ?I performed the procedure and the assistant was needed due to the complexity of the anatomy. ? ?Drema Dallas, DO ? ?

## 2021-07-24 NOTE — Anesthesia Preprocedure Evaluation (Signed)
Anesthesia Evaluation  ?Patient identified by MRN, date of birth, ID band ?Patient awake ? ? ? ?Reviewed: ?Allergy & Precautions, NPO status , Patient's Chart, lab work & pertinent test results ? ?Airway ?Mallampati: II ? ? ? ? ? ? Dental ?no notable dental hx. ? ?  ?Pulmonary ?neg pulmonary ROS,  ?  ?Pulmonary exam normal ? ? ? ? ? ? ? Cardiovascular ?negative cardio ROS ?Normal cardiovascular exam ? ? ?  ?Neuro/Psych ? Headaches,   ? GI/Hepatic ?Neg liver ROS,   ?Endo/Other  ?diabetes ? Renal/GU ?negative Renal ROS  ? ?  ?Musculoskeletal ? ? Abdominal ?Normal abdominal exam  (+)   ?Peds ? Hematology ?negative hematology ROS ?(+)   ?Anesthesia Other Findings ? ? Reproductive/Obstetrics ?(+) Pregnancy ? ?  ? ? ? ? ? ? ? ? ? ? ? ? ? ?  ?  ? ? ? ? ? ? ? ? ?Anesthesia Physical ?Anesthesia Plan ? ?ASA: 2 ? ?Anesthesia Plan: Spinal  ? ?Post-op Pain Management:   ? ?Induction:  ? ?PONV Risk Score and Plan: 0 and Ondansetron ? ?Airway Management Planned:  ? ?Additional Equipment: None ? ?Intra-op Plan:  ? ?Post-operative Plan:  ? ?Informed Consent: I have reviewed the patients History and Physical, chart, labs and discussed the procedure including the risks, benefits and alternatives for the proposed anesthesia with the patient or authorized representative who has indicated his/her understanding and acceptance.  ? ? ? ? ? ?Plan Discussed with: CRNA ? ?Anesthesia Plan Comments: (Lab Results ?     Component                Value               Date                 ?     WBC                      9.0                 07/23/2021           ?     HGB                      9.6 (L)             07/23/2021           ?     HCT                      30.8 (L)            07/23/2021           ?     MCV                      78.4 (L)            07/23/2021           ?     PLT                      277                 07/23/2021           ?)  ? ? ? ? ? ? ?Anesthesia Quick Evaluation ? ?

## 2021-07-24 NOTE — Progress Notes (Signed)
Antepartum Progress Note ?  ?Hospital Day#: 10 ?PPROM Date: 07/14/2021 ?  ?Subjective: Patient doing well now. Earlier this morning, patient had significant contractions she felt every 1-3 minutes. Bedside US confirmed cephalic presentation. She was given Terbutaline as there were no beds available on L&D. Received a 1L IVF bolus as well. She feels very comfortable at this time. Reported earlier when she was contracting that she had some blood-tinged fluid. No overt vaginal bleeding. Endorses good FM. Denies VB or regular CTX. ? ?Denies fevers, chills, chest pain, visual changes, SOB, RUQ/epigastric pain, N/V, dysuria, hematuria, or sudden onset/worsening bilateral LE or facial edema. ? ?  ?Objective: ?Vitals:  ? 07/23/21 2053 07/24/21 0012 07/24/21 0400 07/24/21 0455  ?BP: 123/76 108/66 123/73   ?Pulse: 95 76 73   ?Resp:  18 16 18   ?Temp:  98.6 ?F (37 ?C) 97.6 ?F (36.4 ?C) 98.1 ?F (36.7 ?C)  ?TempSrc:  Oral Oral   ?SpO2:  100% 100%   ?Weight:      ?Height:      ?  ?Gen:  NAD, pleasant and cooperative ?Cardio:  RRR ?Pulm:  No increased work of breathing ?Abd:  Soft, gravid, non-distended, non-tender throughout, no rebound/guarding ?Ext:  No bilateral LE edema, no bilateral calf tenderness ?  ?FHT: 145-150bpm, moderate variability, 15x15 accels, no decelerations ?Toco: no consistent contractions for greater than 1 hour ? ?Results for orders placed or performed during the hospital encounter of 07/14/21 (from the past 24 hour(s))  ?CBC     Status: Abnormal  ? Collection Time: 07/23/21  7:55 AM  ?Result Value Ref Range  ? WBC 9.0 4.0 - 10.5 K/uL  ? RBC 3.93 3.87 - 5.11 MIL/uL  ? Hemoglobin 9.6 (L) 12.0 - 15.0 g/dL  ? HCT 30.8 (L) 36.0 - 46.0 %  ? MCV 78.4 (L) 80.0 - 100.0 fL  ? MCH 24.4 (L) 26.0 - 34.0 pg  ? MCHC 31.2 30.0 - 36.0 g/dL  ? RDW 14.2 11.5 - 15.5 %  ? Platelets 277 150 - 400 K/uL  ? nRBC 0.2 0.0 - 0.2 %  ?Type and screen [facility]     Status: None  ? Collection Time: 07/23/21  7:55 AM   ?Result Value Ref Range  ? ABO/RH(D) A POS   ? Antibody Screen NEG   ? Sample Expiration    ?  07/26/2021,2359 ?Performed at [facility], [address] [place], [place] ?  ?Glucose, capillary     Status: Abnormal  ? Collection Time: 07/23/21 11:59 AM  ?Result Value Ref Range  ? Glucose-Capillary 144 (H) 70 - 99 mg/dL  ?Glucose, capillary     Status: Abnormal  ? Collection Time: 07/23/21  3:57 PM  ?Result Value Ref Range  ? Glucose-Capillary 112 (H) 70 - 99 mg/dL  ?Glucose, capillary     Status: Abnormal  ? Collection Time: 07/23/21  9:34 PM  ?Result Value Ref Range  ? Glucose-Capillary 126 (H) 70 - 99 mg/dL  ?Glucose, capillary     Status: Abnormal  ? Collection Time: 07/24/21  6:03 AM  ?Result Value Ref Range  ? Glucose-Capillary 126 (H) 70 - 99 mg/dL  ?  ?A/P: ?Kathryn Cruz is a 31 y.o. 33 @ [redacted]w[redacted]d admitted for PPROM. ?  ?PPROM ?- Doing well, vital signs stable, no signs/symptoms of chorio ?- FLM: Betamethasone complete 3/29 ?- Antibiotics: S/p Amp/Amox/Azithro for latency x 7 days ?- S/p Magnesium sulfate for fetal neuroprotection x 24h ?-  GBS: Positive (through urine) ?- NSTs q shift ?- Presentation: Fetus cephalic on bedside US on 4/6 ?- Fetal growth appropriate on MFM growth [place] on 3/27 ?- BPP on 3/31 8/8 -- repeat BPP ordered for 4/7 ?- S/p NICU consult on 3/27 ?- Consented for C/S and blood products on 3/27 (due to previous breech presentation) ?- Primary CS scheduled for 4/11 at 1230 but will plan IOL if fetus remains cephalic ?- Inpatient management until delivery at 34 weeks unless signs/symptoms of chorio develop ? ?Preterm Labor ?- On 4/6, patient began regular contractions ?- Last cervical exam (4/6): 3/70/-3 ?- Cephalic on bedside US ?- S/p Terbutaline 0.25mg  at 0451 on 4/6 ?- Continue monitoring on OB Specialty care, if labor ensues, will transfer to L&D ?  ?Class A1DM ?- CBG fasting and 2h postprandial ?- SSI ordered ?- BS 70-124 ?  ?Iron deficiency anemia ?- H/H  7.9/25.2 on 07/17/21 ?- S/p IV Venofer on 3/30 ?- Repeat CBC ordered for 4/7 ? ?Contact precautions ?- Per protocol contact precautions x 1 year due to Extended Spectrum Beta-Lactamase Producer (ESBL) from UTI noted 12/20922 ?- Urine culture on admission is negative ? ?Routine care: ?- Pulm:  RA ?- GI:  Carb-controlled ?- Renal:  Spontaneous voids ?- IVF:  SLIV  ?- Pain:  N/A ?- DVT Prophylaxis:  SCDs in bed, ambulation ?- Activity:  May ambulate ?- Labs:  As above ? ?Steva Ready, DO ? ?

## 2021-07-24 NOTE — Progress Notes (Addendum)
S: ?Contractions continue despite IV bolus. ? ?O: ?Blood pressure 123/73, pulse 73, temperature 97.6 ?F (36.4 ?C), temperature source Oral, resp. rate 16, height 5' (1.524 m), weight 77.1 kg, SpO2 100 %, unknown if currently breastfeeding. ? ?FHR: Baseline 150. Moderate variability/ accels/ no decels ?TOCO: Q 3-5 min, moderate per palpation ?Dilation: 3 ?Effacement (%): 70 ?Station: -2 ?Presentation: Vertex ?Exam by:: Cordelia Pen, rnc ? ?A: ?31 y.o. [id number] [redacted]w[redacted]d ?PPROM ?Preterm labor ?A1DM ?Iron deficiency anemia ?S/P BMZ x 2 doses ? ?P: ?Transfer to L&D ?Vertex confirmed via bedside US. Spine to maternal right.  ?Terbutaline 0.25 SQ now while waiting for L&D room ?Expectant management ?Last CBC 07/23/21 @0755  ?Epidural PRN ?CBG Q 4 hrs in labor ?Plan developed with Dr. ? ?Sallye Ober, MSN, CNM ?07/24/2021 4:55 AM ? ?

## 2021-07-25 ENCOUNTER — Encounter (HOSPITAL_COMMUNITY): Payer: Self-pay | Admitting: Obstetrics and Gynecology

## 2021-07-25 LAB — CBC
HCT: 26.3 % — ABNORMAL LOW (ref 36.0–46.0)
Hemoglobin: 8.4 g/dL — ABNORMAL LOW (ref 12.0–15.0)
MCH: 24.7 pg — ABNORMAL LOW (ref 26.0–34.0)
MCHC: 31.9 g/dL (ref 30.0–36.0)
MCV: 77.4 fL — ABNORMAL LOW (ref 80.0–100.0)
Platelets: 239 10*3/uL (ref 150–400)
RBC: 3.4 MIL/uL — ABNORMAL LOW (ref 3.87–5.11)
RDW: 15 % (ref 11.5–15.5)
WBC: 13.9 10*3/uL — ABNORMAL HIGH (ref 4.0–10.5)
nRBC: 0 % (ref 0.0–0.2)

## 2021-07-25 LAB — GLUCOSE, CAPILLARY: Glucose-Capillary: 128 mg/dL — ABNORMAL HIGH (ref 70–99)

## 2021-07-25 MED ORDER — FERROUS SULFATE 325 (65 FE) MG PO TABS: 325.0000 mg | ORAL_TABLET | Freq: Two times a day (BID) | ORAL | Status: DC

## 2021-07-25 NOTE — Progress Notes (Signed)
Subjective: ?Postpartum Day 1: Cesarean Delivery ?Patient reports incisional pain and no problems voiding.   ? ?Objective: ?Vital signs in last 24 hours: ?Temp:  [97.5 ?F (36.4 ?C)-98.5 ?F (36.9 ?C)] 98.5 ?F (36.9 ?C) (04/07 1400) ?Pulse Rate:  [58-92] 87 (04/07 1400) ?Resp:  [13-20] 18 (04/07 1400) ?BP: (93-109)/(52-72) 106/52 (04/07 1400) ?SpO2:  [97 %-100 %] 100 % (04/07 1400) ? ?Physical Exam:  ?General: alert and cooperative ?Lochia: appropriate ?Uterine Fundus: firm ?Incision: healing well, CDI ?DVT Evaluation: No evidence of DVT seen on physical exam. ? ?Recent Labs  ?  07/23/21 ?0755 07/25/21 ?7793  ?HGB 9.6* 8.4*  ?HCT 30.8* 26.3*  ? ? ?Assessment/Plan: ?Status post Cesarean section. Doing well postoperatively.  ?Pt is anemic but asymptomatic. ? ?Kathryn Cruz ?07/25/2021, 7:39 PM ? ? ?

## 2021-07-25 NOTE — Lactation Note (Signed)
This note was copied from a baby's chart. ? ?NICU Lactation Consultation Note ? ?Patient Name: Kathryn Cruz ?Today's Date: 07/25/2021 ?Age:33 hours ? ? ?Subjective ?Reason for consult: Initial assessment ?Mom recalls bf'ing other children for 5 months. She used a nipple shield and denies milk production issues. Mom has 2 electric pumps at home for use p d/c prn.  ?I provided ed and assisted with initial pumping. Mom was pleased with copious colostrum expression on first pump session. We reviewed volume norms and pumping strategies.  ? ?Objective ?Infant data: ?Mother's Current Feeding Choice: Breast Milk and Donor Milk ? ?Maternal data: ?[id number]  ?C-Section, Low Transverse ?Significant Breast History:: wide breast spacing ? ?Does the patient have breastfeeding experience prior to this delivery?: Yes ? ?Flange Size: 24 (may need 27's) ? ?Risk factor for low milk supply:: GDM, pph, wide-spaced breasts ? ? ?Pump: DEBP, Personal (even flow) ? ? ?Intervention/Plan ?Interventions: Education; [employer]; Infant Driven Feeding Algorithm education; LPT handout/interventions ? ?Tools: Pump; Flanges ?Pump Education: Setup, frequency, and cleaning; Milk Storage ? ?Plan: ?Consult Status: Follow-up ? ?NICU Follow-up type: New admission follow up; Verify onset of copious milk; Maternal D/C visit; Weekly NICU follow up; Assist with IDF-1 (Mother to pre-pump before breastfeeding) ? ?Encouraged to pump 15 minutes q3 ? ? ?Elder Negus ?07/25/2021, 9:51 AM ?

## 2021-07-25 NOTE — Anesthesia Postprocedure Evaluation (Signed)
Anesthesia Post Note ? ?Patient: Kathryn Cruz ? ?Procedure(s) Performed: CESAREAN SECTION ? ?  ? ?Patient location during evaluation: PACU ?Anesthesia Type: Spinal ?Level of consciousness: oriented and awake and alert ?Pain management: pain level controlled ?Vital Signs Assessment: post-procedure vital signs reviewed and stable ?Respiratory status: spontaneous breathing, respiratory function stable and patient connected to nasal cannula oxygen ?Cardiovascular status: blood pressure returned to baseline and stable ?Postop Assessment: no headache, no backache and no apparent nausea or vomiting ?Anesthetic complications: no ? ? ?No notable events documented. ? ?Last Vitals:  ?Vitals:  ? 07/24/21 2209 07/24/21 2310  ?BP: (!) 102/54 103/63  ?Pulse: 83 67  ?Resp: 17 18  ?Temp: 36.4 ?C 36.4 ?C  ?SpO2: 100% 99%  ?  ?Last Pain:  ?Vitals:  ? 07/24/21 2310  ?TempSrc: Oral  ?PainSc: 0-No pain  ? ?Pain Goal: Patients Stated Pain Goal: 3 (07/21/21 1939) ? ?  ?  ?  ?  ?  ?  ?  ? ?Effie Berkshire ? ? ? ? ?

## 2021-07-26 DIAGNOSIS — Z98891 History of uterine scar from previous surgery: Secondary | ICD-10-CM

## 2021-07-26 DIAGNOSIS — Z2239 Carrier of other specified bacterial diseases: Secondary | ICD-10-CM

## 2021-07-26 DIAGNOSIS — D509 Iron deficiency anemia, unspecified: Secondary | ICD-10-CM | POA: Diagnosis present

## 2021-07-26 MED ORDER — ACETAMINOPHEN 500 MG PO TABS
1000.0000 mg | ORAL_TABLET | Freq: Four times a day (QID) | ORAL | Status: DC
Start: 2021-07-26 — End: 2021-07-27
  Administered 2021-07-26 – 2021-07-27 (×6): 1000 mg via ORAL
  Filled 2021-07-26 (×6): qty 2

## 2021-07-26 NOTE — Progress Notes (Signed)
Kathryn Cruz [id number] ?Postpartum Postoperative Day # 2  ?Kathryn Cruz, [phone], [redacted]w[redacted]d, S/P primary LT Cesarean Section due to PPROM with breech. Pt was admitted on 3/27 with [facility] physician pt of dr Delora Fuel at 31.6 for PPROM, pt progressed to labor with breech presentation and was taken to the OR with Dr Delora Fuel for [place] at 33.2 weeks with spinal epidural on the 4/6, qbl was 691mls, hgb drop of 9.6-8.4, on po iron and asymptomatic. Pt on contact precaution r/t previously noted UTI of EBSL, last urine culture showed labtobicillius but normal, no tx needed at this time. Pt rooming in couplet care with NB. Pt is pumping and going well. BC undecided. Pt alos had GDMA1 during prengnacy, no meds, CBG slightly elevated but otherwise normal will f/u 6 weeks PP for 2HGTT to check for overt DM. Baby Female.  ? ?Patient Active Problem List  ? Diagnosis Date Noted  ? S/P cesarean section 07/26/2021  ? Normal postpartum course 07/26/2021  ? ESBL E. coli carrier 07/26/2021  ? Iron deficiency anemia 07/26/2021  ? Preterm delivery 07/26/2021  ? Preterm premature rupture of membranes (PPROM) with unknown onset of labor 07/14/2021  ? Normal labor 05/08/2016  ?  ? ?Active Ambulatory Problems  ?  Diagnosis Date Noted  ? Normal labor 05/08/2016  ? ?Resolved Ambulatory Problems  ?  Diagnosis Date Noted  ? No Resolved Ambulatory Problems  ? ?Past Medical History:  ?Diagnosis Date  ? ESBL (extended spectrum beta-lactamase) producing bacteria infection 07/14/2021  ? Gestational diabetes   ? Headache   ? History of chlamydia   ? Infection   ?  ? ?Subjective: ?Patient up ad lib, denies syncope or dizziness. Reports consuming regular diet without issues and denies N/V. Patient reports 0 bowel movement + passing flatus.  Denies issues with urination and reports bleeding is "lighter."  Patient is pumpfeeding and reports going well.  Desires undecided for postpartum contraception.  Pain is being appropriately managed with use  of po meds.  ? ? ?Objective: ?Patient Vitals for the past 24 hrs: ? BP Temp Temp src Pulse Resp SpO2  ?07/26/21 0900 102/61 97.9 ?F (36.6 ?C) Oral 72 16 100 %  ?07/26/21 0556 112/61 98 ?F (36.7 ?C) Oral 75 17 100 %  ?07/25/21 2122 103/67 98.4 ?F (36.9 ?C) Oral 72 18 100 %  ?  ? ?Physical Exam:  ?General: alert, cooperative, and appears stated age ?Mood/Affect: happy ?Lungs: clear to auscultation, no wheezes, rales or rhonchi, symmetric air entry.  ?Heart: normal rate, regular rhythm, normal S1, S2, no murmurs, rubs, clicks or gallops. ?Breast: breasts appear normal, no suspicious masses, no skin or nipple changes or axillary nodes. ?Abdomen:  + bowel sounds, soft, non-tender ?Incision: healing well, no significant drainage, no dehiscence, no significant erythema, Honeycomb dressing  ?Uterine Fundus: firm, involution -1 ?Lochia: appropriate ?Skin: Warm, Dry. ?DVT Evaluation: No evidence of DVT seen on physical exam. ?Negative Homan's sign. ?No cords or calf tenderness. ?No significant calf/ankle edema. ? ?Labs: ?Recent Labs  ?  07/25/21 ?[id number]  ?HGB 8.4*  ?HCT 26.3*  ?WBC 13.9*  ? ? ?CBG (last 3)  ?Recent Labs  ?  07/24/21 ?1351 07/24/21 ?1825 07/25/21 ?0759  ?GLUCAP 133* 165* 128*  ?  ? ?I/O: ?I/O last 3 completed shifts: ?In: [id number] [I.V.:1400; IV Piggyback:250] ?Out: 3808 [Urine:3100; Blood:708]  ? ?Assessment ?Postpartum Postoperative Day # 2  ?Kathryn Cruz, [phone], [redacted]w[redacted]d, S/P primary LT Cesarean Section due to PPROM with breech. Pt was admitted  on 3/27 with [facility] physician pt of dr Delora Fuel at 31.6 for PPROM, pt progressed to labor with breech presentation and was taken to the OR with Dr Delora Fuel for [facility] at 33.2 weeks with spinal epidural on the 4/6, qbl was 654mls, hgb drop of 9.6-8.4, on po iron and asymptomatic. Pt on contact precaution r/t previously noted UTI of EBSL, last urine culture showed labtobicillius but normal, no tx needed at this time. Pt rooming in couplet care with NB. Pt is pumping and  going well. BC undecided. Pt alos had GDMA1 during prengnacy, no meds, CBG slightly elevated but otherwise normal will f/u 6 weeks PP for 2HGTT to check for overt DM. Baby Female.  Pt stable. -1 [employer]. PumpFeeding. ?Hemodynamically Stable. ? ?Plan: ?Continue other mgmt as ordered ?IDA: PO iron.  ?Preterm delivery: Plan to stay in couplet care until tomorrow.  ?GDM: F/U ou tp at 6 week, no meds, no monitor further needed at this time.  ?H/O EBSL: contact precautions, no abx needed at this time.  ?VTE Prophylactics: SCD, ambulated as tolerates.  ?Pain control: Motrin/Tylenol/Narcotics PRN ?Education given regarding options for contraception, including barrier methods, injectable contraception, IUD placement, oral contraceptives.  ?Plan for discharge tomorrow, Breastfeeding, Lactation consult, and Contraception undecided ? ?Dr. Charlesetta Garibaldi to be updated on patient status ? ?[facility] NP-C, CNM ?07/26/2021, 2:12 PM ?  ?

## 2021-07-26 NOTE — Lactation Note (Signed)
This note was copied from a baby's chart. ?Lactation Consultation Note ? ?Patient Name: Kathryn Cruz ?Today's Date: 07/26/2021 ?Reason for consult: Follow-up assessment;NICU baby;Preterm <34wks;Infant < 6lbs;Maternal endocrine disorder ?Age:33 hours ? ?Visited with mom of 45 hours old pre-term NICU female, she's a P3 and reports the onset of lactogenesis II. She's been pumping consistently during the day but not at night; stressed to Ms. Arguello-Martinez the importance of consistent pumping for the onset of lactogenesis II and to protect her supply. ? ?Reviewed pumping schedule, lactogenesis II, expectations, IDF 1/2 and benefits of premature milk for NICU babies. ? ?Maternal Data ? Mom's supply is WNL ? ?Feeding ?Mother's Current Feeding Choice: Breast Milk and Donor Milk ? ?Lactation Tools Discussed/Used ?Tools: Pump;Flanges ?Flange Size: 24 ?Breast pump type: Double-Electric Breast Pump ?Pump Education: Setup, frequency, and cleaning;Milk Storage ?Reason for Pumping: pre-term in NICU ?Pumping frequency: 6-7 times/24 hours ?Pumped volume: 20 mL ? ?Interventions ?Interventions: Breast feeding basics reviewed;DEBP;Education;Infant Driven Feeding Algorithm education ? ?Plan of care ?Encouraged mom to continue pumping consistently day and night every 3 hours, at least 8 pumping sessions/24 hours ?Hand expression, breast massage and coconut oil were also encouraged prior pumping ?She'll switch her pump setting from initiation to expression mode ? ?Female visitor present and supportive. This is a bilingual family, consult was in both English/Spanish. All questions and concerns answered, family to contact [facility] services PRN. ? ?Discharge ?Pump: DEBP;Personal (Even[employer]) ? ?Consult Status ?Consult Status: Follow-up ?Date: 07/26/21 ?Follow-up type: In-patient ? ? ?Gaylon Melchor S Avyaan Summer ?07/26/2021, 5:35 PM ? ? ? ?

## 2021-07-27 MED ORDER — OXYCODONE HCL 5 MG PO TABS: 5.0000 mg | ORAL_TABLET | Freq: Four times a day (QID) | ORAL | 0 refills | Status: DC | PRN

## 2021-07-27 MED ORDER — FERROUS SULFATE 325 (65 FE) MG PO TABS: 325.0000 mg | ORAL_TABLET | Freq: Two times a day (BID) | ORAL | 3 refills | Status: DC

## 2021-07-27 MED ORDER — IBUPROFEN 600 MG PO TABS: 600.0000 mg | ORAL_TABLET | Freq: Four times a day (QID) | ORAL | 0 refills | Status: DC

## 2021-07-27 NOTE — Lactation Note (Signed)
This note was copied from a baby's chart. ? ?NICU Lactation Consultation Note ? ?Patient Name: [name] ?Today's Date: 07/27/2021 ?Age:31 years ? ? ?Subjective ?Reason for consult: Follow-up assessment; NICU baby; Preterm <34wks; Infant < 6lbs ? ?Lactation followed up with [name]. She was using her DEBP upon entry. Copious milk volume noted for day 3. She denied engorgement. [name] has been pumping more frequently than q3 hours today; she is being discharged and desires to pump enough milk for baby to have overnight. ? ?I reviewed how to manage engorgement and recommended that she could pump q2-3 hours during the day and q3-4 hours during the night.  ? ?Objective ?Infant data: ?Mother's Current Feeding Choice: Breast Milk and Donor Milk ? ?Infant feeding assessment ?Scale for Readiness: -- (no score as this baby is under 34 weeks) ? ?Maternal data: ?[id number]  ?C-Section, Low Transverse ? ?Current breast feeding challenges:: NICU ? ?Does the patient have breastfeeding experience prior to this delivery?: Yes ? ?Pumping frequency: rec q 3 hours; has been pumping more frequently today ?Pumped volume: 45 mL ?Flange Size: 24 ? ? ?Pump: Personal ? ?Assessment ? ?Maternal: ?Milk volume: Normal ? ? ?Intervention/Plan ?Interventions: Breast feeding basics reviewed; DEBP; Education ? ?Tools: Pump ?Pump Education: Setup, frequency, and cleaning; Milk Storage ? ?Plan: ?Consult Status: Follow-up ? ?NICU Follow-up type: New admission follow up; Verify absence of engorgement; Weekly NICU follow up ? ? ? ?Lenore Manner ?07/27/2021, 4:32 PM ?

## 2021-07-27 NOTE — Discharge Summary (Signed)
?  [facility] Discharge Summary ? ?   ?Patient Name: Kathryn Cruz ?DOB: 09-11-1990 ?MRN: [id number] ? ?Date of admission: 07/14/2021 ?Delivering MD: Connye Burkitt, MELISSA  ?Date of delivery: 07/24/2021 ?Type of delivery: PCS ? ?Newborn Data: ?Sex: Baby female ?Live born female  ?Birth Weight: 4 lb 10.1 oz (2100 g) ?APGAR: 8, 9 ? ?Newborn Delivery   ?Birth date/time: 07/24/2021 19:48:00 ?Delivery type: C-Section, Low Transverse ?Trial of labor: No ?C-section categorization: Primary ?  ?  ? ?Feeding: breast with pumping  ?Infant being discharge to home with mother in stable condition.  ? ?Admitting diagnosis: Preterm premature rupture of membranes (PPROM) with unknown onset of labor [O42.919] ?Intrauterine pregnancy: [redacted]w[redacted]d     ?Secondary diagnosis:  Principal Problem: ?  Preterm premature rupture of membranes (PPROM) with unknown onset of labor ?Active Problems: ?  S/P cesarean section ?  Normal postpartum course ?  ESBL E. coli carrier ?  Iron deficiency anemia ?  Preterm delivery ?                              ? ?Complications: ROM>24 hours                                                              ?Intrapartum Procedures: cesarean: low cervical, transverse and GBS prophylaxis ?Postpartum Procedures: none ?Complications-Operative and Postpartum: none ?Augmentation: N/A ? ? ?History of Present Illness: ?Kathryn Cruz is a 31 y.o. female, [phone], who presents at [redacted]w[redacted]d weeks gestation. The patient has been followed at [facility] ?Her pregnancy has been complicated by: ? ?Patient Active Problem List  ? Diagnosis Date Noted  ? S/P cesarean section 07/26/2021  ? Normal postpartum course 07/26/2021  ? ESBL E. coli carrier 07/26/2021  ? Iron deficiency anemia 07/26/2021  ? Preterm delivery 07/26/2021  ? Preterm premature rupture of membranes (PPROM) with unknown onset of labor 07/14/2021  ? Normal labor 05/08/2016  ?  ? ?Active Ambulatory Problems  ?  Diagnosis Date Noted  ? Normal labor  05/08/2016  ? ?Resolved Ambulatory Problems  ?  Diagnosis Date Noted  ? No Resolved Ambulatory Problems  ? ?Past Medical History:  ?Diagnosis Date  ? ESBL (extended spectrum beta-lactamase) producing bacteria infection 07/14/2021  ? Gestational diabetes   ? Headache   ? History of chlamydia   ? Infection   ?  ? ?Hospital course:  Sceduled C/S   31 y.o. yo G3P2103 at [redacted]w[redacted]d was admitted to the hospital 07/14/2021 for scheduled cesarean section with the following indication: PPROM , preterm labor and fetal malpresentation, breech.  .Delivery details are as follows:  ?Membrane Rupture Time/Date: 6:20 AM ,07/14/2021   ?Delivery Method:C-Section, Low Transverse  ?Details of operation can be found in separate operative note.  Patient had an uncomplicated postpartum course.  She is ambulating, tolerating a regular diet, passing flatus, and urinating well. Patient is discharged home in stable condition on  07/27/21 ?       ?Newborn Data: ?Birth date:07/24/2021  ?Birth time:7:48 PM  ?Gender:Female  ?Living status:Living  ?Apgars:8 ,9  ?Weight:2100 g    ? ? ?Hospital Course--Unscheduled Cesarean:  ?Postpartum Postoperative Day # 3  ?Laroy Apple, [phone], [redacted]w[redacted]d, S/P primary LT Cesarean Section  due to PPROM with breech. Pt was admitted on 3/27 with [facility] physician pt of dr Connye Burkittdavies at 31.6 for PPROM, pt progressed to labor with breech presentation and was taken to the OR with [facility] 33.2 weeks with spinal epidural on the 4/6, qbl was 688mls, hgb drop of 9.6-8.4, on po iron and asymptomatic. Pt on contact precaution r/t previously noted UTI of EBSL, last urine culture showed labtobicillius but normal, no tx needed at this time. Pt rooming in couplet care with NB. Pt is pumping and going well. BC undecided. Pt alos had GDMA1 during prengnacy, no meds, CBG slightly elevated but otherwise normal will f/u 6 weeks PP for 2HGTT to check for overt DM. Baby Female. Patient up ad lib, denies syncope or dizziness.  Reports consuming regular diet without issues and denies N/V. Patient reports 0 bowel movement + passing flatus.  Denies issues with urination and reports bleeding is "lighter."  Patient is pumpfeeding and reports going well.  Desires undecided for postpartum contraception.  Pain is being appropriately managed with use of po meds.  ? ?Physical exam  ?Vitals:  ? 07/26/21 0900 07/26/21 1700 07/26/21 2317 07/27/21 1015  ?BP: 102/61 102/65 (!) 109/57 113/63  ?Pulse: 72 85 96 89  ?Resp: 16 14 16 14   ?Temp: 97.9 ?F (36.6 ?C) 97.7 ?F (36.5 ?C) 97.9 ?F (36.6 ?C) 97.8 ?F (36.6 ?C)  ?TempSrc: Oral Oral Oral Oral  ?SpO2: 100% 99%  100%  ?Weight:      ?Height:      ? ?General: alert, cooperative, and no distress ?Lochia: appropriate ?Uterine Fundus: firm ?Incision: Healing well with no significant drainage, No significant erythema, Dressing is clean, dry, and intact, honeycomb dressing CDI ?Perineum: intact ?DVT Evaluation: No evidence of DVT seen on physical exam. ?Negative Homan's sign. ?No cords or calf tenderness. ?No significant calf/ankle edema. ? ?Labs: ?Lab Results  ?Component Value Date  ? WBC 13.9 (H) 07/25/2021  ? HGB 8.4 (L) 07/25/2021  ? HCT 26.3 (L) 07/25/2021  ? MCV 77.4 (L) 07/25/2021  ? PLT 239 07/25/2021  ? ? ?  Latest Ref Rng & Units 08/23/2019  ?  9:01 PM  ?CMP  ?Glucose 70 - 99 mg/dL 161125    ?BUN 6 - 20 mg/dL 8    ?Creatinine 0.44 - 1.00 mg/dL 0.960.67    ?Sodium 135 - 145 mmol/L 137    ?Potassium 3.5 - 5.1 mmol/L 3.7    ?Chloride 98 - 111 mmol/L 102    ?CO2 22 - 32 mmol/L 26    ?Calcium 8.9 - 10.3 mg/dL 9.2    ? ? ?Date of discharge: 07/27/2021 ?Discharge Diagnoses: PPROM and preterm labor with delivery at 33.2 weeks ?Discharge instruction: per After Visit Summary and "Baby and Me Booklet". ? ?After visit meds:  ? ?Activity:           unrestricted and pelvic rest Advance as tolerated. Pelvic rest for 6 weeks.  ?Diet:                routine ?Medications: PNV, Ibuprofen, Colace, Iron, and oxy ir  ?Postpartum  contraception: Undecided ?Condition:  Pt discharge to home in stable condition NB remain stable preterm in NICU>  ?Anemia: PO iron.  ? ?Meds: ?Allergies as of 07/27/2021   ?No Known Allergies ?  ? ?  ?Medication List  ?  ? ?TAKE these medications   ? ?cyclobenzaprine 10 MG tablet ?Commonly known as: FLEXERIL ?Take 1 tablet (10 mg total) by mouth  3 (three) times daily as needed (back pain). ?  ?ferrous sulfate 325 (65 FE) MG tablet ?Take 1 tablet (325 mg total) by mouth 2 (two) times daily with a meal. ?  ?ibuprofen 600 MG tablet ?Commonly known as: ADVIL ?Take 1 tablet (600 mg total) by mouth every 6 (six) hours. ?  ?oxyCODONE 5 MG immediate release tablet ?Commonly known as: Oxy IR/ROXICODONE ?Take 1 tablet (5 mg total) by mouth every 6 (six) hours as needed for moderate pain. ?  ?prenatal multivitamin Tabs tablet ?Take 1 tablet by mouth daily at 12 noon. ?  ? ?  ? ?  ?  ? ? ?  ?Discharge Care Instructions  ?(From admission, onward)  ?  ? ? ?  ? ?  Start     Ordered  ? 07/27/21 0000  Discharge wound care:       ?Comments: Take dressing off on day 5-7 postpartum.  Report increased drainage, redness or warmth. Clean with water, let soap trickle down body. Can leave steri strips on until they fall off or take them off gently at day 10. Keep open to air, clean and dry.  ? 07/27/21 1321  ? ?  ?  ? ?  ? ? ?Discharge Follow Up:  ? Follow-up Information   ? ? Gynecology, [facility]. Schedule an appointment as soon as possible for a visit in 6 week(s).   ?Specialty: Obstetrics and Gynecology ?Contact information: ?[address] ?[address] ?[place] [address] ?[phone] ? ? ?  ?  ? ?  ?  ? ?  ?  ? ?Dale Plymouth, NP-C, CNM ?07/27/2021, 1:23 PM  ?Dale Chie[place]and, FNP ? ? ? ? ? ? ?

## 2021-07-28 LAB — SURGICAL PATHOLOGY

## 2021-07-29 ENCOUNTER — Ambulatory Visit: Payer: Self-pay

## 2021-07-29 NOTE — Lactation Note (Signed)
This note was copied from a baby's chart. ? ?NICU Lactation Consultation Note ? ?Patient Name: Kathryn Cruz ?Today's Date: 07/29/2021 ?Age:33 days ? ? ?Subjective ?Reason for consult: Follow-up assessment ?Mother is pumping often and with abundant output. She denies breast over-fullness and/or discomfort.  ?Mother has attempted to bf and is aware to pre-pump at this time.  ? ?Objective ?Infant data: ?Mother's Current Feeding Choice: Breast Milk ? ?Infant feeding assessment ?Scale for Readiness: 2 ? ?  ?Maternal data: ?[id number]  ?C-Section, Low Transverse ?Pumping frequency: q3 ?Pumped volume: 240 mL ?Pump: Personal ? ?Assessment ?Maternal: ?Milk volume: Abundant ? ? ?Intervention/Plan ?Interventions: Education ? ?Plan: ?Consult Status: Follow-up ? ?NICU Follow-up type: Assist with IDF-1 (Mother to pre-pump before breastfeeding); Weekly NICU follow up ? ? ? ?Elder Negus ?07/29/2021, 10:55 AM ?

## 2021-08-04 ENCOUNTER — Ambulatory Visit: Payer: Self-pay

## 2021-08-04 NOTE — Lactation Note (Signed)
This note was copied from a baby's chart. ?Lactation Consultation Note ? ?Patient Name: Kathryn Cruz ?Today's Date: 08/04/2021 ?Reason for consult: Follow-up assessment;Infant < 6lbs;NICU baby;Late-preterm 34-36.6wks;Maternal endocrine disorder ?Age:31 years ? ?Visited with mom of 75 34/55 weeks old (adjusted) NICU female, she's a P3 and reports having an oversupply. She asked LC for information regarding milk banks, she's interested in donating her milk. Ms. Etta Quill reported that she started taking baby to breast last week, she's been doing some feeding cues (but minimal) she'll latch with NS # 20 but not without it yet. Asked mom to call for feeding assist when needed. ? ?Reviewed lactogenesis III, supply/demand, engorgement, feeding cues, IDF 1/2, and strategies to start slowly down-regulating her supply.  ? ?Maternal Data ? Maternal supply is abundant and ANL ? ?Feeding ?Mother's Current Feeding Choice: Breast Milk ? ?Lactation Tools Discussed/Used ?Tools: Pump;Flanges ?Flange Size: 24 ?Breast pump type: Double-Electric Breast Pump ?Pump Education: Setup, frequency, and cleaning;Milk Storage ?Reason for Pumping: LPI in NICU ?Pumping frequency: 6-7 times/24 hours ?Pumped volume: 240 mL ? ?Interventions ?Interventions: Breast feeding basics reviewed;DEBP;Education;Infant Driven Feeding Algorithm education ? ?Plan of care ?Encouraged mom to continue pumping consistently every 3 hours without fully emptying the breast. She'll continue going without pumping at night  ?She'll start working on some pre-feeding activities such as STS during gavage feedings and paci dips ?She'll continue taking baby to a pumped breast using NS # 20 PRN ?  ?No other support person at this time This is a bilingual family, consult was in both [place]. All questions and concerns answered, family to contact [facility] services PRN. ? ?Discharge ?Discharge Education: Engorgement and breast care ?Pump: DEBP;Personal  (Evenflo) ? ?Consult Status ?Consult Status: Follow-up ?Date: 08/04/21 ?Follow-up type: In-patient ? ? ?Alfreda Hammad S Rolfe Hartsell ?08/04/2021, 4:05 PM ? ? ? ?

## 2021-08-05 ENCOUNTER — Telehealth (HOSPITAL_COMMUNITY): Payer: Self-pay

## 2021-08-05 NOTE — Telephone Encounter (Signed)
"  I'm doing great. I have a sharp pain sometimes at my incision that radiates down to my vagina. I take my motrin and it goes away." RN talked to patient about surgical gas pain and pain with healing from surgery. RN encouraged patient to take pain medication as prescribed by her OB. RN told patient to reach out to her OB if she feels her pain is increased or she is concerned. "My incision is looking good. I took the dressing off." RN reviewed signs of infection at incision site to watch for and incision care. Patient has no other questions or concerns about her healing.  ? ?"She's doing great. She is in the NICU. I'm visiting her now." ? ?EPDS score is 3. ? ?Sharyn Lull Rane Dumm,RN3,MSN,RNC-MNN ?08/05/2021,1247 ?

## 2021-08-11 ENCOUNTER — Ambulatory Visit: Payer: Self-pay

## 2021-08-11 NOTE — Lactation Note (Signed)
This note was copied from a baby's chart. ?Lactation Consultation Note ? ?Patient Name: Kathryn Cruz ?Today's Date: 08/11/2021 ?Reason for consult: Maternal endocrine disorder;Late-preterm 34-36.6wks;Follow-up assessment;NICU baby ?Age:33 wk.o. ? ?Visited with mom of 21 48/31 weeks old (adjusted) NICU female, she's a P3 and reports that pumping is going well, she has finally reached the point where she's no longer in pain in between pumping sessions and she can pump every 4-5 hours at her own pace. Milk supply has slightly decreased, asked Kathryn Cruz to continue working with lactation for maintenance phase; she's happy with her supply as it is now.  ? ?She continues taking baby to breast independently using NS # 20 and makes sure she's pumping a bit prior feedings at the breast; baby is able to keep up with her flow and she can hear swallows, last LATCH score was 9. Asked Kathryn Cruz to call for latch assistance when needed, baby napping on her bassinet during [facility] consultation. ? ?Maternal Data ? Maternal supply has dwindled and is now WNL ? ?Feeding ?Mother's Current Feeding Choice: Breast Milk ?Nipple Type: Dr. Myra Gianotti Preemie ? ?Lactation Tools Discussed/Used ?Tools: Pump;Flanges ?Flange Size: 24 ?Breast pump type: Double-Electric Breast Pump ?Pump Education: Setup, frequency, and cleaning;Milk Storage ?Reason for Pumping: LPI in NICU ?Pumping frequency: 5 times/24 hours ?Pumped volume: 180 mL ? ?Interventions ?Interventions: Breast feeding basics reviewed;DEBP;Education ? ?Plan of care ?Encouraged mom to continue pumping consistently every 4-5 hours. She'll continue going without pumping at night  ?She'll start working on some pre-feeding activities such as STS during gavage feedings and paci dips ?She'll continue taking baby to a semi-pumped breast using NS # 20 PRN ?  ?No other support person at this time This is a bilingual family, consult was in both [place]. All  questions and concerns answered, mom to contact [place] services PRN. ? ?Discharge ?Pump: DEBP;Personal (Evenflo) ? ?Consult Status ?Consult Status: Follow-up ?Date: 08/11/21 ?Follow-up type: In-patient ? ? ?Kathryn Cruz ?08/11/2021, 12:21 PM ? ? ? ?

## 2021-08-15 ENCOUNTER — Ambulatory Visit: Payer: Self-pay

## 2021-08-15 NOTE — Lactation Note (Signed)
This note was copied from a baby's chart. ? ?NICU Lactation Consultation Note ? ?Patient Name: Kathryn Cruz ?Today's Date: 08/15/2021 ?Age:33 wk.o. ? ? ?Subjective ?Reason for consult: Follow-up assessment; NICU baby ? ? ?Lactation followed up with [name]. She declined breastfeeding assistance for this feeding; she states that she will practice bottle feeding this time. SLP was scheduled to assist. She states that baby is latching well. She would like lactation to follow up on 4/29 in the morning. I notified RN. ? ?[name] reports her pumping volumes have continued to be stable as compared to our previous lactation visit. Baby is waking prior to scheduled feeding times and showing increased readiness for P.O. feedings. ? ?Objective ?Infant data: ?Mother's Current Feeding Choice: Breast Milk ? ?Infant feeding assessment ?Scale for Readiness: 1 ?Scale for Quality: 3 ? ?Maternal data: ?[id number]  ?C-Section, Low Transverse ?Current breast feeding challenges:: NICU ? ? ?Pump: DEBP, Personal (Evenflo) ? ?Assessment ?Maternal: ?Milk volume: Normal ? ? ?Intervention/Plan ?No data recorded ?Pump Education: Setup, frequency, and cleaning ? ?Plan: ?Consult Status: Follow-up ? ?NICU Follow-up type: Weekly NICU follow up ? ? ? ?Walker Shadow ?08/15/2021, 3:06 PM ?

## 2021-08-20 ENCOUNTER — Ambulatory Visit: Payer: Self-pay

## 2021-08-20 NOTE — Lactation Note (Signed)
This note was copied from a baby's chart. ? ?NICU Lactation Consultation Note ? ?Patient Name: Kathryn Cruz ?Today's Date: 08/20/2021 ?Age:33 wk.o. ? ?Subjective ?Reason for consult: Follow-up assessment; NICU baby; Early term 37-38.6wks ? ?Visited with mom of 65 30/66 weeks old (adjusted) NICU female, she's a P3 and reports a further decrease in her supply, although is still WNL. Reviewed some strategies to increase her supply, taking baby to breast regularly was also discussed; Ms. Arguello-Martinez hasn't been nursing daily; she's been working more on bottle feedings. Asked her to call for assistance when needed.  ? ?Objective ?Infant data: ?Mother's Current Feeding Choice: Breast Milk ? ?Infant feeding assessment ?Scale for Readiness: 2 ?Scale for Quality: 3 ? ?Maternal data: ?[id number]  ?C-Section, Low Transverse ?Pumping frequency: 5 times/24 hours ?Pumped volume: 120 mL (120-150 ml) ?Flange Size: 24 ?Pump: DEBP, Personal (Evenflo) ? ?Assessment ?Maternal: ?Milk volume: Normal ? ?Intervention/Plan ?No data recorded ?Tools: Pump; Flanges ?Pump Education: Setup, frequency, and cleaning; Milk Storage ? ?Plan of care: ?Encouraged mom to continue pumping consistently every 3 hours. She'll start waking up at least once at night ?She'll start power pumping in the AM ?She'll continue taking baby to a full breast using NS # 20 PRN ?  ?No other support person at this time This is a bilingual family, consult was in both [place]. All questions and concerns answered, mom to contact LC services PRN. ? ?Consult Status: Follow-up ?NICU Follow-up type: Weekly NICU follow up; Assist with IDF-2 (Mother does not need to pre-pump before breastfeeding) ? ? ?Kathryn Cruz ?08/20/2021, 2:55 PM ?

## 2022-02-18 DIAGNOSIS — E782 Mixed hyperlipidemia: Secondary | ICD-10-CM | POA: Diagnosis not present

## 2022-02-18 DIAGNOSIS — D508 Other iron deficiency anemias: Secondary | ICD-10-CM | POA: Diagnosis not present

## 2022-02-18 DIAGNOSIS — R7303 Prediabetes: Secondary | ICD-10-CM | POA: Diagnosis not present

## 2022-02-18 DIAGNOSIS — E669 Obesity, unspecified: Secondary | ICD-10-CM | POA: Diagnosis not present

## 2022-02-18 DIAGNOSIS — N912 Amenorrhea, unspecified: Secondary | ICD-10-CM | POA: Diagnosis not present

## 2022-02-18 DIAGNOSIS — R635 Abnormal weight gain: Secondary | ICD-10-CM | POA: Diagnosis not present

## 2022-02-18 DIAGNOSIS — J0100 Acute maxillary sinusitis, unspecified: Secondary | ICD-10-CM | POA: Diagnosis not present

## 2022-04-20 NOTE — L&D Delivery Note (Signed)
Delivery Note Arrived to room to evaluate for cervical change. Patient was found to be complete and +2 station. Fetal head was delivered over intact perineum. No nuchal was present. Shoulders and body followed without difficulty. Infant was placed on mother's abdomen, mouth and nose were bulb suctioned and let out a vigorous cry. Delayed cord clamping was performed for 60 seconds. Cord clamped and cut by FOB. Venous cord blood was collected. Placenta delivered spontaneously. Cervix, vagina, perineum and labia were inspected and no tears were noted. Uterus fundus firm. TXA 1g IV was given as prophylaxis. Placenta was inspected, found to be intact with a 3 vessel cord. Counts were correct.   At 6:53 AM a viable and healthy female was delivered via VBAC, Spontaneous (Presentation: LOA).  APGAR: 9,9 ; weight: pending Placenta status: Spontaneous, Intact.  Cord:  3 vessels with the following complications: None.  Cord pH: Not collected  Anesthesia:  Epidural Episiotomy:  N/A Lacerations:  None Suture Repair:  N/A Est. Blood Loss (mL):  55  Mom to postpartum.  Baby to Couplet care / Skin to Skin.  Steva Ready 04/10/2023, 7:02 AM

## 2022-04-29 DIAGNOSIS — J069 Acute upper respiratory infection, unspecified: Secondary | ICD-10-CM | POA: Diagnosis not present

## 2022-08-05 DIAGNOSIS — L299 Pruritus, unspecified: Secondary | ICD-10-CM | POA: Diagnosis not present

## 2022-08-05 DIAGNOSIS — T7840XA Allergy, unspecified, initial encounter: Secondary | ICD-10-CM | POA: Diagnosis not present

## 2022-08-05 DIAGNOSIS — R21 Rash and other nonspecific skin eruption: Secondary | ICD-10-CM | POA: Diagnosis not present

## 2022-08-07 DIAGNOSIS — N911 Secondary amenorrhea: Secondary | ICD-10-CM | POA: Diagnosis not present

## 2022-08-07 DIAGNOSIS — Z3201 Encounter for pregnancy test, result positive: Secondary | ICD-10-CM | POA: Diagnosis not present

## 2022-08-11 DIAGNOSIS — O26899 Other specified pregnancy related conditions, unspecified trimester: Secondary | ICD-10-CM | POA: Diagnosis not present

## 2022-08-11 DIAGNOSIS — Z3480 Encounter for supervision of other normal pregnancy, unspecified trimester: Secondary | ICD-10-CM | POA: Diagnosis not present

## 2022-08-11 LAB — OB RESULTS CONSOLE RUBELLA ANTIBODY, IGM: Rubella: IMMUNE

## 2022-08-11 LAB — OB RESULTS CONSOLE HIV ANTIBODY (ROUTINE TESTING): HIV: NONREACTIVE

## 2022-08-11 LAB — OB RESULTS CONSOLE ANTIBODY SCREEN: Antibody Screen: NEGATIVE

## 2022-08-11 LAB — HEPATITIS C ANTIBODY: HCV Ab: NEGATIVE

## 2022-08-11 LAB — OB RESULTS CONSOLE HEPATITIS B SURFACE ANTIGEN: Hepatitis B Surface Ag: NEGATIVE

## 2022-08-13 DIAGNOSIS — O3680X0 Pregnancy with inconclusive fetal viability, not applicable or unspecified: Secondary | ICD-10-CM | POA: Diagnosis not present

## 2022-08-25 DIAGNOSIS — Z3687 Encounter for antenatal screening for uncertain dates: Secondary | ICD-10-CM | POA: Diagnosis not present

## 2022-08-28 DIAGNOSIS — Z3481 Encounter for supervision of other normal pregnancy, first trimester: Secondary | ICD-10-CM | POA: Diagnosis not present

## 2022-09-09 ENCOUNTER — Inpatient Hospital Stay (HOSPITAL_COMMUNITY)
Admission: AD | Admit: 2022-09-09 | Discharge: 2022-09-10 | Disposition: A | Discharge status: Home / Self Care | Payer: Medicaid Other | Attending: Obstetrics and Gynecology | Admitting: Obstetrics and Gynecology

## 2022-09-09 DIAGNOSIS — O26891 Other specified pregnancy related conditions, first trimester: Secondary | ICD-10-CM | POA: Insufficient documentation

## 2022-09-09 DIAGNOSIS — O26899 Other specified pregnancy related conditions, unspecified trimester: Secondary | ICD-10-CM

## 2022-09-09 DIAGNOSIS — Z3A09 9 weeks gestation of pregnancy: Secondary | ICD-10-CM | POA: Insufficient documentation

## 2022-09-09 DIAGNOSIS — R102 Pelvic and perineal pain: Secondary | ICD-10-CM | POA: Insufficient documentation

## 2022-09-09 DIAGNOSIS — Z3490 Encounter for supervision of normal pregnancy, unspecified, unspecified trimester: Secondary | ICD-10-CM

## 2022-09-10 ENCOUNTER — Inpatient Hospital Stay (HOSPITAL_COMMUNITY): Payer: Medicaid Other

## 2022-09-10 ENCOUNTER — Encounter (HOSPITAL_COMMUNITY): Payer: Self-pay

## 2022-09-10 DIAGNOSIS — Z3A09 9 weeks gestation of pregnancy: Secondary | ICD-10-CM | POA: Diagnosis not present

## 2022-09-10 DIAGNOSIS — O26891 Other specified pregnancy related conditions, first trimester: Secondary | ICD-10-CM

## 2022-09-10 DIAGNOSIS — R102 Pelvic and perineal pain: Secondary | ICD-10-CM | POA: Diagnosis not present

## 2022-09-10 DIAGNOSIS — R109 Unspecified abdominal pain: Secondary | ICD-10-CM | POA: Diagnosis not present

## 2022-09-10 LAB — URINALYSIS, ROUTINE W REFLEX MICROSCOPIC
Bilirubin Urine: NEGATIVE
Glucose, UA: NEGATIVE mg/dL
Hgb urine dipstick: NEGATIVE
Ketones, ur: NEGATIVE mg/dL
Leukocytes,Ua: NEGATIVE
Nitrite: NEGATIVE
Protein, ur: NEGATIVE mg/dL
Specific Gravity, Urine: 1.009 (ref 1.005–1.030)
pH: 6 (ref 5.0–8.0)

## 2022-09-10 MED ORDER — IBUPROFEN 800 MG PO TABS
400.0000 mg | ORAL_TABLET | Freq: Once | ORAL | Status: AC
  Administered 2022-09-10: 400 mg via ORAL
  Filled 2022-09-10: qty 1

## 2022-09-10 NOTE — MAU Provider Note (Signed)
Chief Complaint: Abdominal Pain   Event Date/Time   First Provider Initiated Contact with Patient 09/10/22 0029        SUBJECTIVE HPI: Kathryn Cruz is a 32 y.o. [id number] at [redacted]w[redacted]d by LMP who presents to maternity admissions reporting pelvic cramping and intermittent sharp pains in to vagina.  No bleeding.  Just had a baby a year ago. States US done in office a few weeks ago showed "a low heartbeat".   She denies vaginal bleeding, vaginal itching/burning, urinary symptoms, h/a, dizziness, n/v, or fever/chills.     Abdominal Pain This is a new problem. The current episode started in the past 7 days. The problem has been unchanged. The quality of the pain is cramping. Pertinent negatives include no constipation, diarrhea, dysuria, fever, headaches or myalgias. Nothing aggravates the pain. The pain is relieved by Nothing. She has tried nothing for the symptoms.   RN Note: Kathryn Cruz is a 32 y.o. at Unknown here in MAU reporting: intermittent lower ABD cramping that pt states feels like CTX, lighting crouch, lower back pain, leg pain since Monday. Pt took Tylenol 1000mg  around 2100, no relief. Pt denies VB.  [place] two weeks ago, Pt reports FHR was low.  Onset of complaint: Monday  Pain score: 8/10  Past Medical History:  Diagnosis Date   ESBL (extended spectrum beta-lactamase) producing bacteria infection 07/14/2021   Gestational diabetes    diet controlled, glyburide   Headache    History of chlamydia    Infection    uti   Past Surgical History:  Procedure Laterality Date   CESAREAN SECTION N/A 07/24/2021   Procedure: CESAREAN SECTION;  Surgeon: Steva Ready, DO;  Location: [facility] LD ORS;  Service: Obstetrics;  Laterality: N/A;   NO PAST SURGERIES     WISDOM TOOTH EXTRACTION     Social History   Socioeconomic History   Marital status: Single    Spouse name: Not on file   Number of children: Not on file   Years of education: Not on file   Highest education  level: Not on file  Occupational History   Not on file  Tobacco Use   Smoking status: Never   Smokeless tobacco: Never  Vaping Use   Vaping Use: Never used  Substance and Sexual Activity   Alcohol use: No   Drug use: No   Sexual activity: Yes    Birth control/protection: None  Other Topics Concern   Not on file  Social History Narrative   Not on file   Social Determinants of Health   Financial Resource Strain: Not on file  Food Insecurity: Not on file  Transportation Needs: Not on file  Physical Activity: Not on file  Stress: Not on file  Social Connections: Not on file  Intimate Partner Violence: Not on file   No current facility-administered medications on file prior to encounter.   Current Outpatient Medications on File Prior to Encounter  Medication Sig Dispense Refill   cyclobenzaprine (FLEXERIL) 10 MG tablet Take 1 tablet (10 mg total) by mouth 3 (three) times daily as needed (back pain). 20 tablet 0   ferrous sulfate 325 (65 FE) MG tablet Take 1 tablet (325 mg total) by mouth 2 (two) times daily with a meal. 30 tablet 3   ibuprofen (ADVIL) 600 MG tablet Take 1 tablet (600 mg total) by mouth every 6 (six) hours. 30 tablet 0   oxyCODONE (OXY IR/ROXICODONE) 5 MG immediate release tablet Take 1 tablet (5 mg total)  by mouth every 6 (six) hours as needed for moderate pain. 20 tablet 0   Prenatal Vit-Fe Fumarate-FA (PRENATAL MULTIVITAMIN) TABS tablet Take 1 tablet by mouth daily at 12 noon.     No Known Allergies  I have reviewed patient's Past Medical Hx, Surgical Hx, Family Hx, Social Hx, medications and allergies.   ROS:  Review of Systems  Constitutional:  Negative for fever.  Gastrointestinal:  Positive for abdominal pain. Negative for constipation and diarrhea.  Genitourinary:  Negative for dysuria.  Musculoskeletal:  Negative for myalgias.  Neurological:  Negative for headaches.   Review of Systems  Other systems negative   Physical Exam  Physical  Exam Patient Vitals for the past 24 hrs:  BP Temp Temp src Pulse Resp SpO2 Height Weight  09/10/22 0020 116/66 98.3 F (36.8 C) Oral 80 18 100 % 5' (1.524 m) 76.4 kg   Constitutional: Well-developed, well-nourished female in no acute distress.  Cardiovascular: normal rate Respiratory: normal effort GI: Abd soft, non-tender.  MS: Extremities nontender, no edema, normal ROM Neurologic: Alert and oriented x 4.  GU: Neg CVAT.  PELVIC EXAM: deferred in lieu of ultrasound  LAB RESULTS Results for orders placed or performed during the hospital encounter of 09/09/22 (from the past 24 hour(s))  Urinalysis, Routine w reflex microscopic -Urine, Clean Catch     Status: Abnormal   Collection Time: 09/10/22  1:27 AM  Result Value Ref Range   Color, Urine STRAW (A) YELLOW   APPearance CLEAR CLEAR   Specific Gravity, Urine 1.009 1.005 - 1.030   pH 6.0 5.0 - 8.0   Glucose, UA NEGATIVE NEGATIVE mg/dL   Hgb urine dipstick NEGATIVE NEGATIVE   Bilirubin Urine NEGATIVE NEGATIVE   Ketones, ur NEGATIVE NEGATIVE mg/dL   Protein, ur NEGATIVE NEGATIVE mg/dL   Nitrite NEGATIVE NEGATIVE   Leukocytes,Ua NEGATIVE NEGATIVE       IMAGING US OB Comp Less 14 Wks  Result Date: 09/10/2022 CLINICAL DATA:  Cramping.  Beta HCG unknown.  LMP 06/22/2022 EXAM: OBSTETRIC <14 WK ULTRASOUND TECHNIQUE: Transabdominal ultrasound was performed for evaluation of the gestation as well as the maternal uterus and adnexal regions. COMPARISON:  None Available. FINDINGS: Intrauterine gestational sac: Single Yolk sac:  Visualized. Embryo:  Visualized. Cardiac Activity: Visualized. Heart Rate: 171 bpm CRL:   24.9 mm   9 w 1 d                  [place] EDC: 04/14/2023 Subchorionic hemorrhage:  None visualized. Maternal uterus/adnexae: Normal ovaries.  No free fluid. IMPRESSION: Single living intrauterine gestation. Electronically Signed   By: Minerva Fester M.D.   On: 09/10/2022 00:54     MAU Management/MDM: I have reviewed the triage  vital signs and the nursing notes.   Pertinent labs & imaging results that were available during my care of the patient were reviewed by me and considered in my medical decision making (see chart for details).      I have reviewed her medical records including past results, notes and treatments. Medical, Surgical, and family history were reviewed.  Medications and recent lab tests were reviewed  Ordered Ultrasound to rule out SAB  Treatments in MAU included ibuprofen.   Reviewed results of normal [place], pt is relieved UA is normal  Discussed pain is likely from closely spaced pregnancy causing support ligaments to be weakened.  Suggest support belt  ASSESSMENT Single iUP at [redacted]w[redacted]d Pelvic pain  PLAN Discharge home Pregnancy support belt  Pt stable  at time of discharge. Encouraged to return here if she develops worsening of symptoms, increase in pain, fever, or other concerning symptoms.    Wynelle Bourgeois CNM, MSN Certified Nurse-Midwife 09/10/2022  12:29 AM

## 2022-09-10 NOTE — MAU Note (Signed)
.  Kathryn Cruz is a 32 y.o. at Unknown here in MAU reporting: intermittent lower ABD cramping that pt states feels like CTX, lighting crouch, lower back pain, leg pain since Monday. Pt took Tylenol 1000mg  around 2100, no relief. Pt denies VB.  [place] two weeks ago, Pt reports FHR was low.  Onset of complaint: Monday  Pain score: 8/10 Vitals:   09/10/22 0020  BP: 116/66  Pulse: 80  Resp: 18  Temp: 98.3 F (36.8 C)  SpO2: 100%     FHT:RN attempted, unable to assess Lab orders placed from triage:

## 2022-09-15 DIAGNOSIS — O368310 Maternal care for abnormalities of the fetal heart rate or rhythm, first trimester, not applicable or unspecified: Secondary | ICD-10-CM | POA: Diagnosis not present

## 2022-09-22 DIAGNOSIS — Z3490 Encounter for supervision of normal pregnancy, unspecified, unspecified trimester: Secondary | ICD-10-CM | POA: Diagnosis not present

## 2022-09-22 DIAGNOSIS — Z6832 Body mass index (BMI) 32.0-32.9, adult: Secondary | ICD-10-CM | POA: Diagnosis not present

## 2022-09-22 DIAGNOSIS — Z8632 Personal history of gestational diabetes: Secondary | ICD-10-CM | POA: Diagnosis not present

## 2022-09-22 DIAGNOSIS — R110 Nausea: Secondary | ICD-10-CM | POA: Diagnosis not present

## 2022-11-02 DIAGNOSIS — Z3482 Encounter for supervision of other normal pregnancy, second trimester: Secondary | ICD-10-CM | POA: Diagnosis not present

## 2022-11-02 DIAGNOSIS — Z3490 Encounter for supervision of normal pregnancy, unspecified, unspecified trimester: Secondary | ICD-10-CM | POA: Diagnosis not present

## 2022-12-10 DIAGNOSIS — O26892 Other specified pregnancy related conditions, second trimester: Secondary | ICD-10-CM | POA: Diagnosis not present

## 2022-12-10 DIAGNOSIS — N898 Other specified noninflammatory disorders of vagina: Secondary | ICD-10-CM | POA: Diagnosis not present

## 2022-12-10 DIAGNOSIS — O26899 Other specified pregnancy related conditions, unspecified trimester: Secondary | ICD-10-CM | POA: Diagnosis not present

## 2022-12-28 DIAGNOSIS — Z3482 Encounter for supervision of other normal pregnancy, second trimester: Secondary | ICD-10-CM | POA: Diagnosis not present

## 2022-12-28 DIAGNOSIS — Z36 Encounter for antenatal screening for chromosomal anomalies: Secondary | ICD-10-CM | POA: Diagnosis not present

## 2022-12-28 DIAGNOSIS — R519 Headache, unspecified: Secondary | ICD-10-CM | POA: Diagnosis not present

## 2023-01-18 ENCOUNTER — Encounter (HOSPITAL_COMMUNITY): Payer: Self-pay | Admitting: Obstetrics and Gynecology

## 2023-01-18 ENCOUNTER — Inpatient Hospital Stay (HOSPITAL_COMMUNITY)
Admission: AD | Admit: 2023-01-18 | Discharge: 2023-01-18 | Disposition: A | Discharge status: Home / Self Care | Payer: Medicaid Other | Attending: Obstetrics and Gynecology | Admitting: Obstetrics and Gynecology

## 2023-01-18 DIAGNOSIS — O479 False labor, unspecified: Secondary | ICD-10-CM | POA: Diagnosis not present

## 2023-01-18 DIAGNOSIS — O26893 Other specified pregnancy related conditions, third trimester: Secondary | ICD-10-CM | POA: Diagnosis present

## 2023-01-18 DIAGNOSIS — O4702 False labor before 37 completed weeks of gestation, second trimester: Secondary | ICD-10-CM | POA: Diagnosis not present

## 2023-01-18 DIAGNOSIS — N949 Unspecified condition associated with female genital organs and menstrual cycle: Secondary | ICD-10-CM

## 2023-01-18 DIAGNOSIS — Z3A27 27 weeks gestation of pregnancy: Secondary | ICD-10-CM | POA: Diagnosis not present

## 2023-01-18 LAB — WET PREP, GENITAL
Clue Cells Wet Prep HPF POC: NONE SEEN
Sperm: NONE SEEN
Trich, Wet Prep: NONE SEEN
WBC, Wet Prep HPF POC: <10 (ref <10)
Yeast Wet Prep HPF POC: NONE SEEN

## 2023-01-18 LAB — URINALYSIS, ROUTINE W REFLEX MICROSCOPIC
Bilirubin Urine: NEGATIVE
Glucose, UA: 50 mg/dL — AB
Hgb urine dipstick: NEGATIVE
Ketones, ur: NEGATIVE mg/dL
Nitrite: NEGATIVE
Protein, ur: NEGATIVE mg/dL
Specific Gravity, Urine: 1.018 (ref 1.005–1.030)
pH: 6 (ref 5.0–8.0)

## 2023-01-18 MED ORDER — CYCLOBENZAPRINE HCL 10 MG PO TABS: 10.0000 mg | ORAL_TABLET | Freq: Three times a day (TID) | ORAL | 0 refills | Status: DC | PRN

## 2023-01-18 NOTE — MAU Provider Note (Addendum)
History     CSN: [id number]  Arrival date and time: 01/18/23 4098   Event Date/Time   First Provider Initiated Contact with Patient 01/18/23 1849      Chief Complaint  Patient presents with   Contractions   HPI  Patient presents with painful contractions. Only occurring about once per hour but feels very strong. Additionally having pelvic pressure with walking. Had intercourse last night. Denies VB, LOF, change in vaginal discharge, urinary symptoms. +FM.  Patient had PPROM at 31 weeks during last pregnancy and delivered at 33 weeks.  Past Medical History:  Diagnosis Date   ESBL (extended spectrum beta-lactamase) producing bacteria infection 07/14/2021   Gestational diabetes    diet controlled, glyburide   Headache    History of chlamydia    Infection    uti    Past Surgical History:  Procedure Laterality Date   CESAREAN SECTION N/A 07/24/2021   Procedure: CESAREAN SECTION;  Surgeon: Steva Ready, DO;  Location: [facility] LD ORS;  Service: Obstetrics;  Laterality: N/A;   NO PAST SURGERIES     WISDOM TOOTH EXTRACTION      Family History  Problem Relation Age of Onset   Hypertension Mother    Diabetes Mother    Hyperlipidemia Mother    Hypertension Maternal Grandmother    Cancer Paternal Grandmother        throat    Social History   Tobacco Use   Smoking status: Never   Smokeless tobacco: Never  Vaping Use   Vaping status: Never Used  Substance Use Topics   Alcohol use: No   Drug use: No    Allergies: No Known Allergies  Medications Prior to Admission  Medication Sig Dispense Refill Last Dose   Prenatal Vit-Fe Fumarate-FA (PRENATAL MULTIVITAMIN) TABS tablet Take 1 tablet by mouth daily at 12 noon.   01/18/2023   cyclobenzaprine (FLEXERIL) 10 MG tablet Take 1 tablet (10 mg total) by mouth 3 (three) times daily as needed (back pain). 20 tablet 0    ferrous sulfate 325 (65 FE) MG tablet Take 1 tablet (325 mg total) by mouth 2 (two) times daily with a meal. 30  tablet 3     Review of Systems  See HPI.  Physical Exam   Blood pressure 107/67, pulse 91, temperature 98 F (36.7 C), temperature source Oral, resp. rate 14, SpO2 100%, unknown if currently breastfeeding.  Physical Exam Constitutional:      General: She is not in acute distress.    Appearance: She is obese. She is not ill-appearing.  Cardiovascular:     Rate and Rhythm: Normal rate and regular rhythm.  Pulmonary:     Effort: Pulmonary effort is normal.  Abdominal:     Comments: Gravid  Genitourinary:    Comments: Normal external genitalia. Speculum exam without pooling, blood. Does have physiologic discharge present. Mild uterine prolapse. Cervix closed/thick/high Musculoskeletal:        General: No swelling.  Skin:    General: Skin is warm and dry.  Neurological:     Mental Status: Mental status is at baseline.  Psychiatric:        Mood and Affect: Mood normal.    Results for orders placed or performed during the hospital encounter of 01/18/23 (from the past 24 hour(s))  Urinalysis, Routine w reflex microscopic -Urine, Clean Catch     Status: Abnormal   Collection Time: 01/18/23  6:30 PM  Result Value Ref Range   Color, Urine YELLOW YELLOW   APPearance  HAZY (A) CLEAR   Specific Gravity, Urine 1.018 1.005 - 1.030   pH 6.0 5.0 - 8.0   Glucose, UA 50 (A) NEGATIVE mg/dL   Hgb urine dipstick NEGATIVE NEGATIVE   Bilirubin Urine NEGATIVE NEGATIVE   Ketones, ur NEGATIVE NEGATIVE mg/dL   Protein, ur NEGATIVE NEGATIVE mg/dL   Nitrite NEGATIVE NEGATIVE   Leukocytes,Ua SMALL (A) NEGATIVE   RBC / HPF 0-5 0 - 5 RBC/hpf   WBC, UA 0-5 0 - 5 WBC/hpf   Bacteria, UA FEW (A) NONE SEEN   Squamous Epithelial / HPF 11-20 0 - 5 /HPF   Mucus PRESENT   Wet prep, genital     Status: None   Collection Time: 01/18/23  7:00 PM   Specimen: PATH Cytology Cervicovaginal Ancillary Only  Result Value Ref Range   Yeast Wet Prep HPF POC NONE SEEN NONE SEEN   Trich, Wet Prep NONE SEEN NONE  SEEN   Clue Cells Wet Prep HPF POC NONE SEEN NONE SEEN   WBC, Wet Prep HPF POC <10 <10   Sperm NONE SEEN     FWB: FHT 150/moderate variability/+accels 10x10/no decels. No ctx  MAU Course  Procedures  MDM Patient with rare preterm contractions. Given closed cervix and no ctx on monitor, do not suspect preterm labor at this time. Will rule-out UTI, vaginitis. Additionally patient is experiencing round ligament pain while walking.  1941: Reassessed patient. Had had no contractions/cramping while here and feels well. Discussed conservative measures for Braxton-Hicks and round ligament pain. Patient verbalized understanding. Return precautions for preterm labor given.  Urine culture and GC/C pending at discharge.  Assessment and Plan   #Braxton-Hicks: Encouraged to stay hydrated. No UTI, vaginitis, s/sx of preterm labor.  #Round ligament pain: Tylenol, flexeril, belly band. Conservative measures and education provided.  Joanne Gavel, MD 01/18/2023, 7:41 PM

## 2023-01-18 NOTE — MAU Note (Signed)
..  Kathryn Cruz is a 32 y.o. at [redacted]w[redacted]d here in MAU reporting: since yesterday she started having contractions and vaginal pressure. Has progressively gotten worse. States its uncomfortable to walk. Denies VB or LOF. +FM. Denies urinary s/sx. Last IC was last night, but cramping started prior to intercourse.   Pain score: 7 Vitals:   01/18/23 1838  BP: 107/67  Pulse: 91  Resp: 14  Temp: 98 F (36.7 C)  SpO2: 100%     FHT:140 Lab orders placed from triage:   UA

## 2023-01-19 LAB — GC/CHLAMYDIA PROBE AMP (~~LOC~~) NOT AT ARMC
Chlamydia: NEGATIVE
Comment: NEGATIVE
Comment: NORMAL
Neisseria Gonorrhea: NEGATIVE

## 2023-01-21 LAB — CULTURE, OB URINE: Culture: NO GROWTH

## 2023-01-26 DIAGNOSIS — Z3483 Encounter for supervision of other normal pregnancy, third trimester: Secondary | ICD-10-CM | POA: Diagnosis not present

## 2023-01-26 DIAGNOSIS — Z3490 Encounter for supervision of normal pregnancy, unspecified, unspecified trimester: Secondary | ICD-10-CM | POA: Diagnosis not present

## 2023-01-26 DIAGNOSIS — B3731 Acute candidiasis of vulva and vagina: Secondary | ICD-10-CM | POA: Diagnosis not present

## 2023-01-26 DIAGNOSIS — Z23 Encounter for immunization: Secondary | ICD-10-CM | POA: Diagnosis not present

## 2023-01-26 DIAGNOSIS — N898 Other specified noninflammatory disorders of vagina: Secondary | ICD-10-CM | POA: Diagnosis not present

## 2023-01-26 LAB — OB RESULTS CONSOLE RPR: RPR: NONREACTIVE

## 2023-01-28 DIAGNOSIS — Z3483 Encounter for supervision of other normal pregnancy, third trimester: Secondary | ICD-10-CM | POA: Diagnosis not present

## 2023-01-28 DIAGNOSIS — R7309 Other abnormal glucose: Secondary | ICD-10-CM | POA: Diagnosis not present

## 2023-02-03 DIAGNOSIS — Z8632 Personal history of gestational diabetes: Secondary | ICD-10-CM | POA: Diagnosis not present

## 2023-02-03 DIAGNOSIS — R7309 Other abnormal glucose: Secondary | ICD-10-CM | POA: Diagnosis not present

## 2023-02-24 DIAGNOSIS — O24419 Gestational diabetes mellitus in pregnancy, unspecified control: Secondary | ICD-10-CM | POA: Diagnosis not present

## 2023-02-24 DIAGNOSIS — O99213 Obesity complicating pregnancy, third trimester: Secondary | ICD-10-CM | POA: Diagnosis not present

## 2023-02-24 DIAGNOSIS — O26843 Uterine size-date discrepancy, third trimester: Secondary | ICD-10-CM | POA: Diagnosis not present

## 2023-03-08 DIAGNOSIS — O24419 Gestational diabetes mellitus in pregnancy, unspecified control: Secondary | ICD-10-CM | POA: Diagnosis not present

## 2023-03-08 DIAGNOSIS — R102 Pelvic and perineal pain: Secondary | ICD-10-CM | POA: Diagnosis not present

## 2023-03-08 DIAGNOSIS — O99213 Obesity complicating pregnancy, third trimester: Secondary | ICD-10-CM | POA: Diagnosis not present

## 2023-03-08 DIAGNOSIS — N898 Other specified noninflammatory disorders of vagina: Secondary | ICD-10-CM | POA: Diagnosis not present

## 2023-03-24 ENCOUNTER — Other Ambulatory Visit: Payer: Self-pay

## 2023-03-24 DIAGNOSIS — Z3490 Encounter for supervision of normal pregnancy, unspecified, unspecified trimester: Secondary | ICD-10-CM | POA: Diagnosis not present

## 2023-03-24 DIAGNOSIS — O24419 Gestational diabetes mellitus in pregnancy, unspecified control: Secondary | ICD-10-CM | POA: Diagnosis not present

## 2023-03-24 DIAGNOSIS — Z3483 Encounter for supervision of other normal pregnancy, third trimester: Secondary | ICD-10-CM | POA: Diagnosis not present

## 2023-03-24 DIAGNOSIS — O99213 Obesity complicating pregnancy, third trimester: Secondary | ICD-10-CM | POA: Diagnosis not present

## 2023-03-24 LAB — OB RESULTS CONSOLE GC/CHLAMYDIA
Chlamydia: NEGATIVE
Neisseria Gonorrhea: NEGATIVE

## 2023-03-24 LAB — OB RESULTS CONSOLE GBS: GBS: POSITIVE

## 2023-03-24 MED ORDER — RSV PRE-FUSION F A&B VAC RCMB 120 MCG/0.5ML IM SOLR
0.5000 mL | Freq: Once | INTRAMUSCULAR | 0 refills | Status: AC
  Filled 2023-03-24: qty 1, 1d supply, fill #0

## 2023-04-01 DIAGNOSIS — Z3490 Encounter for supervision of normal pregnancy, unspecified, unspecified trimester: Secondary | ICD-10-CM | POA: Diagnosis not present

## 2023-04-01 DIAGNOSIS — Z3A37 37 weeks gestation of pregnancy: Secondary | ICD-10-CM | POA: Diagnosis not present

## 2023-04-01 DIAGNOSIS — O24419 Gestational diabetes mellitus in pregnancy, unspecified control: Secondary | ICD-10-CM | POA: Diagnosis not present

## 2023-04-01 DIAGNOSIS — L299 Pruritus, unspecified: Secondary | ICD-10-CM | POA: Diagnosis not present

## 2023-04-05 DIAGNOSIS — O24419 Gestational diabetes mellitus in pregnancy, unspecified control: Secondary | ICD-10-CM | POA: Diagnosis not present

## 2023-04-08 ENCOUNTER — Encounter (HOSPITAL_COMMUNITY): Payer: Self-pay

## 2023-04-08 ENCOUNTER — Telehealth (HOSPITAL_COMMUNITY): Payer: Self-pay | Admitting: *Deleted

## 2023-04-08 NOTE — Telephone Encounter (Signed)
Preadmission screen  

## 2023-04-09 ENCOUNTER — Inpatient Hospital Stay (HOSPITAL_COMMUNITY)
Admission: AD | Admit: 2023-04-09 | Discharge: 2023-04-12 | DRG: 806 | Disposition: A | Discharge status: Home / Self Care | Payer: Medicaid Other | Attending: Obstetrics and Gynecology | Admitting: Obstetrics and Gynecology

## 2023-04-09 ENCOUNTER — Encounter (HOSPITAL_COMMUNITY): Payer: Self-pay | Admitting: Obstetrics and Gynecology

## 2023-04-09 DIAGNOSIS — Z3689 Encounter for other specified antenatal screening: Secondary | ICD-10-CM

## 2023-04-09 DIAGNOSIS — Z3A38 38 weeks gestation of pregnancy: Secondary | ICD-10-CM

## 2023-04-09 DIAGNOSIS — O9882 Other maternal infectious and parasitic diseases complicating childbirth: Secondary | ICD-10-CM | POA: Diagnosis present

## 2023-04-09 DIAGNOSIS — O24415 Gestational diabetes mellitus in pregnancy, controlled by oral hypoglycemic drugs: Secondary | ICD-10-CM | POA: Diagnosis present

## 2023-04-09 DIAGNOSIS — O99214 Obesity complicating childbirth: Secondary | ICD-10-CM | POA: Diagnosis present

## 2023-04-09 DIAGNOSIS — Z0371 Encounter for suspected problem with amniotic cavity and membrane ruled out: Principal | ICD-10-CM

## 2023-04-09 DIAGNOSIS — O99824 Streptococcus B carrier state complicating childbirth: Secondary | ICD-10-CM | POA: Diagnosis present

## 2023-04-09 DIAGNOSIS — O98819 Other maternal infectious and parasitic diseases complicating pregnancy, unspecified trimester: Secondary | ICD-10-CM

## 2023-04-09 DIAGNOSIS — Z3A39 39 weeks gestation of pregnancy: Secondary | ICD-10-CM

## 2023-04-09 DIAGNOSIS — O24425 Gestational diabetes mellitus in childbirth, controlled by oral hypoglycemic drugs: Principal | ICD-10-CM | POA: Diagnosis present

## 2023-04-09 DIAGNOSIS — B3731 Acute candidiasis of vulva and vagina: Secondary | ICD-10-CM | POA: Diagnosis present

## 2023-04-09 DIAGNOSIS — O34219 Maternal care for unspecified type scar from previous cesarean delivery: Principal | ICD-10-CM | POA: Diagnosis present

## 2023-04-09 NOTE — MAU Note (Addendum)
Pt says EDC - 12-29 Is sch for an Induction 12-26 Thinks yesterday at 10pm- got out of shower- and fluid came out . Then happened again at 0300 and today at 8pm tonight. [facility] - VE on 12-16   1-2 cm  Denies HSV GBS- positive  Feels UC's- not close  5/10

## 2023-04-09 NOTE — MAU Note (Signed)
In B-room

## 2023-04-10 ENCOUNTER — Inpatient Hospital Stay (HOSPITAL_COMMUNITY): Payer: Medicaid Other | Admitting: Anesthesiology

## 2023-04-10 ENCOUNTER — Encounter (HOSPITAL_COMMUNITY): Payer: Self-pay | Admitting: Obstetrics and Gynecology

## 2023-04-10 ENCOUNTER — Other Ambulatory Visit: Payer: Self-pay

## 2023-04-10 DIAGNOSIS — O34219 Maternal care for unspecified type scar from previous cesarean delivery: Secondary | ICD-10-CM | POA: Diagnosis not present

## 2023-04-10 DIAGNOSIS — Z3A38 38 weeks gestation of pregnancy: Secondary | ICD-10-CM | POA: Diagnosis not present

## 2023-04-10 DIAGNOSIS — Z0371 Encounter for suspected problem with amniotic cavity and membrane ruled out: Secondary | ICD-10-CM | POA: Diagnosis not present

## 2023-04-10 DIAGNOSIS — Z3A39 39 weeks gestation of pregnancy: Secondary | ICD-10-CM

## 2023-04-10 DIAGNOSIS — O99824 Streptococcus B carrier state complicating childbirth: Secondary | ICD-10-CM | POA: Diagnosis not present

## 2023-04-10 DIAGNOSIS — O24425 Gestational diabetes mellitus in childbirth, controlled by oral hypoglycemic drugs: Secondary | ICD-10-CM | POA: Diagnosis not present

## 2023-04-10 DIAGNOSIS — B3731 Acute candidiasis of vulva and vagina: Secondary | ICD-10-CM | POA: Diagnosis not present

## 2023-04-10 DIAGNOSIS — O9882 Other maternal infectious and parasitic diseases complicating childbirth: Secondary | ICD-10-CM | POA: Diagnosis not present

## 2023-04-10 DIAGNOSIS — O26893 Other specified pregnancy related conditions, third trimester: Secondary | ICD-10-CM | POA: Diagnosis not present

## 2023-04-10 DIAGNOSIS — O99214 Obesity complicating childbirth: Secondary | ICD-10-CM | POA: Diagnosis not present

## 2023-04-10 DIAGNOSIS — O24415 Gestational diabetes mellitus in pregnancy, controlled by oral hypoglycemic drugs: Secondary | ICD-10-CM | POA: Diagnosis present

## 2023-04-10 LAB — POCT FERN TEST
POCT Fern Test: NEGATIVE
POCT Fern Test: POSITIVE

## 2023-04-10 LAB — CBC
HCT: 37.7 % (ref 36.0–46.0)
Hemoglobin: 12.3 g/dL (ref 12.0–15.0)
MCH: 26.2 pg (ref 26.0–34.0)
MCHC: 32.6 g/dL (ref 30.0–36.0)
MCV: 80.4 fL (ref 80.0–100.0)
Platelets: 221 10*3/uL (ref 150–400)
RBC: 4.69 MIL/uL (ref 3.87–5.11)
RDW: 14.9 % (ref 11.5–15.5)
WBC: 8.2 10*3/uL (ref 4.0–10.5)
nRBC: 0 % (ref 0.0–0.2)

## 2023-04-10 LAB — TYPE AND SCREEN
ABO/RH(D): A POS
Antibody Screen: NEGATIVE

## 2023-04-10 LAB — GLUCOSE, CAPILLARY: Glucose-Capillary: 81 mg/dL (ref 70–99)

## 2023-04-10 LAB — RPR: RPR Ser Ql: NONREACTIVE

## 2023-04-10 MED ORDER — OXYCODONE HCL 5 MG PO TABS: 5.0000 mg | ORAL_TABLET | ORAL | Status: DC | PRN

## 2023-04-10 MED ORDER — ERYTHROMYCIN 5 MG/GM OP OINT
TOPICAL_OINTMENT | OPHTHALMIC | Status: AC
  Filled 2023-04-10: qty 1

## 2023-04-10 MED ORDER — PHENYLEPHRINE 80 MCG/ML (10ML) SYRINGE FOR IV PUSH (FOR BLOOD PRESSURE SUPPORT): 80.0000 ug | PREFILLED_SYRINGE | INTRAVENOUS | Status: DC | PRN

## 2023-04-10 MED ORDER — PRENATAL MULTIVITAMIN CH
1.0000 | ORAL_TABLET | Freq: Every day | ORAL | Status: DC
  Administered 2023-04-10 – 2023-04-12 (×3): 1 via ORAL
  Filled 2023-04-10 (×3): qty 1

## 2023-04-10 MED ORDER — OXYTOCIN-SODIUM CHLORIDE 30-0.9 UT/500ML-% IV SOLN
2.5000 [IU]/h | INTRAVENOUS | Status: DC
  Administered 2023-04-10: 2.5 [IU]/h via INTRAVENOUS
  Filled 2023-04-10: qty 500

## 2023-04-10 MED ORDER — ACETAMINOPHEN 325 MG PO TABS: 650.0000 mg | ORAL_TABLET | ORAL | Status: DC | PRN

## 2023-04-10 MED ORDER — TERBUTALINE SULFATE 1 MG/ML IJ SOLN: 0.2500 mg | Freq: Once | INTRAMUSCULAR | Status: DC | PRN

## 2023-04-10 MED ORDER — LACTATED RINGERS IV SOLN: 500.0000 mL | INTRAVENOUS | Status: DC | PRN

## 2023-04-10 MED ORDER — SOD CITRATE-CITRIC ACID 500-334 MG/5ML PO SOLN: 30.0000 mL | ORAL | Status: DC | PRN

## 2023-04-10 MED ORDER — OXYTOCIN BOLUS FROM INFUSION
333.0000 mL | Freq: Once | INTRAVENOUS | Status: AC
  Administered 2023-04-10: 333 mL via INTRAVENOUS

## 2023-04-10 MED ORDER — BENZOCAINE-MENTHOL 20-0.5 % EX AERO: 1.0000 | INHALATION_SPRAY | CUTANEOUS | Status: DC | PRN

## 2023-04-10 MED ORDER — LIDOCAINE HCL (PF) 1 % IJ SOLN: 30.0000 mL | INTRAMUSCULAR | Status: DC | PRN

## 2023-04-10 MED ORDER — ZOLPIDEM TARTRATE 5 MG PO TABS: 5.0000 mg | ORAL_TABLET | Freq: Every evening | ORAL | Status: DC | PRN

## 2023-04-10 MED ORDER — COCONUT OIL OIL
1.0000 | TOPICAL_OIL | Status: DC | PRN
  Administered 2023-04-12: 1 via TOPICAL

## 2023-04-10 MED ORDER — IBUPROFEN 600 MG PO TABS
600.0000 mg | ORAL_TABLET | Freq: Four times a day (QID) | ORAL | Status: DC
  Administered 2023-04-10 – 2023-04-12 (×9): 600 mg via ORAL
  Filled 2023-04-10 (×9): qty 1

## 2023-04-10 MED ORDER — EPHEDRINE 5 MG/ML INJ: 10.0000 mg | INTRAVENOUS | Status: DC | PRN

## 2023-04-10 MED ORDER — DIBUCAINE (PERIANAL) 1 % EX OINT: 1.0000 | TOPICAL_OINTMENT | CUTANEOUS | Status: DC | PRN

## 2023-04-10 MED ORDER — LIDOCAINE HCL (PF) 1 % IJ SOLN
INTRAMUSCULAR | Status: DC | PRN
  Administered 2023-04-10: 5 mL via EPIDURAL
  Administered 2023-04-10: 4 mL via EPIDURAL

## 2023-04-10 MED ORDER — DIPHENHYDRAMINE HCL 25 MG PO CAPS: 25.0000 mg | ORAL_CAPSULE | Freq: Four times a day (QID) | ORAL | Status: DC | PRN

## 2023-04-10 MED ORDER — OXYCODONE-ACETAMINOPHEN 5-325 MG PO TABS: 2.0000 | ORAL_TABLET | ORAL | Status: DC | PRN

## 2023-04-10 MED ORDER — SENNOSIDES-DOCUSATE SODIUM 8.6-50 MG PO TABS
2.0000 | ORAL_TABLET | Freq: Every day | ORAL | Status: DC
  Administered 2023-04-11 – 2023-04-12 (×2): 2 via ORAL
  Filled 2023-04-10 (×2): qty 2

## 2023-04-10 MED ORDER — ONDANSETRON HCL 4 MG/2ML IJ SOLN: 4.0000 mg | INTRAMUSCULAR | Status: DC | PRN

## 2023-04-10 MED ORDER — ONDANSETRON HCL 4 MG PO TABS
4.0000 mg | ORAL_TABLET | ORAL | Status: DC | PRN
Start: 2023-04-10 — End: 2023-04-12

## 2023-04-10 MED ORDER — ACETAMINOPHEN 325 MG PO TABS
650.0000 mg | ORAL_TABLET | ORAL | Status: DC | PRN
Start: 2023-04-10 — End: 2023-04-12

## 2023-04-10 MED ORDER — LACTATED RINGERS IV SOLN: 500.0000 mL | Freq: Once | INTRAVENOUS | Status: DC

## 2023-04-10 MED ORDER — LACTATED RINGERS IV SOLN: INTRAVENOUS | Status: DC

## 2023-04-10 MED ORDER — TERCONAZOLE 0.8 % VA CREA: 1.0000 | TOPICAL_CREAM | Freq: Every day | VAGINAL | 0 refills | Status: AC

## 2023-04-10 MED ORDER — OXYCODONE HCL 5 MG PO TABS: 10.0000 mg | ORAL_TABLET | ORAL | Status: DC | PRN

## 2023-04-10 MED ORDER — TETANUS-DIPHTH-ACELL PERTUSSIS 5-2.5-18.5 LF-MCG/0.5 IM SUSY: 0.5000 mL | PREFILLED_SYRINGE | Freq: Once | INTRAMUSCULAR | Status: DC

## 2023-04-10 MED ORDER — PENICILLIN G POT IN DEXTROSE 60000 UNIT/ML IV SOLN: 3.0000 10*6.[IU] | INTRAVENOUS | Status: DC

## 2023-04-10 MED ORDER — OXYTOCIN-SODIUM CHLORIDE 30-0.9 UT/500ML-% IV SOLN: 1.0000 m[IU]/min | INTRAVENOUS | Status: DC

## 2023-04-10 MED ORDER — FENTANYL-BUPIVACAINE-NACL 0.5-0.125-0.9 MG/250ML-% EP SOLN
12.0000 mL/h | EPIDURAL | Status: DC | PRN
  Administered 2023-04-10: 12 mL/h via EPIDURAL
  Filled 2023-04-10: qty 250

## 2023-04-10 MED ORDER — SIMETHICONE 80 MG PO CHEW: 80.0000 mg | CHEWABLE_TABLET | ORAL | Status: DC | PRN

## 2023-04-10 MED ORDER — DIPHENHYDRAMINE HCL 50 MG/ML IJ SOLN: 12.5000 mg | INTRAMUSCULAR | Status: DC | PRN

## 2023-04-10 MED ORDER — FENTANYL CITRATE (PF) 100 MCG/2ML IJ SOLN: 50.0000 ug | INTRAMUSCULAR | Status: DC | PRN

## 2023-04-10 MED ORDER — WITCH HAZEL-GLYCERIN EX PADS: 1.0000 | MEDICATED_PAD | CUTANEOUS | Status: DC | PRN

## 2023-04-10 MED ORDER — TRANEXAMIC ACID-NACL 1000-0.7 MG/100ML-% IV SOLN
INTRAVENOUS | Status: AC
  Filled 2023-04-10: qty 100

## 2023-04-10 MED ORDER — SODIUM CHLORIDE 0.9 % IV SOLN
5.0000 10*6.[IU] | Freq: Once | INTRAVENOUS | Status: AC
  Administered 2023-04-10: 5 10*6.[IU] via INTRAVENOUS
  Filled 2023-04-10: qty 5

## 2023-04-10 MED ORDER — ONDANSETRON HCL 4 MG/2ML IJ SOLN: 4.0000 mg | Freq: Four times a day (QID) | INTRAMUSCULAR | Status: DC | PRN

## 2023-04-10 MED ORDER — INSULIN ASPART 100 UNIT/ML IJ SOLN: 0.0000 [IU] | INTRAMUSCULAR | Status: DC

## 2023-04-10 MED ORDER — OXYCODONE-ACETAMINOPHEN 5-325 MG PO TABS: 1.0000 | ORAL_TABLET | ORAL | Status: DC | PRN

## 2023-04-10 MED ORDER — TRANEXAMIC ACID-NACL 1000-0.7 MG/100ML-% IV SOLN
1000.0000 mg | Freq: Once | INTRAVENOUS | Status: AC
  Administered 2023-04-10: 1000 mg via INTRAVENOUS

## 2023-04-10 MED ORDER — HYDROXYZINE HCL 50 MG PO TABS: 50.0000 mg | ORAL_TABLET | Freq: Four times a day (QID) | ORAL | Status: DC | PRN

## 2023-04-10 NOTE — Anesthesia Preprocedure Evaluation (Signed)
Anesthesia Evaluation  Patient identified by MRN, date of birth, ID band Patient awake    Reviewed: Allergy & Precautions, NPO status , Patient's Chart, lab work & pertinent test results  History of Anesthesia Complications Negative for: history of anesthetic complications  Airway Mallampati: II   Neck ROM: Full    Dental   Pulmonary neg pulmonary ROS   Pulmonary exam normal        Cardiovascular negative cardio ROS Normal cardiovascular exam     Neuro/Psych  Headaches  negative psych ROS   GI/Hepatic negative GI ROS, Neg liver ROS,,,  Endo/Other  diabetes, Gestational, Oral Hypoglycemic Agents   Obesity   Renal/GU negative Renal ROS     Musculoskeletal negative musculoskeletal ROS (+)    Abdominal   Peds  Hematology negative hematology ROS (+)  Plt 221k    Anesthesia Other Findings   Reproductive/Obstetrics (+) Pregnancy  TOLAC                               Anesthesia Physical Anesthesia Plan  ASA: 2  Anesthesia Plan: Epidural   Post-op Pain Management: Minimal or no pain anticipated   Induction:   PONV Risk Score and Plan: 2 and Treatment may vary due to age or medical condition  Airway Management Planned: Natural Airway  Additional Equipment: None  Intra-op Plan:   Post-operative Plan:   Informed Consent: I have reviewed the patients History and Physical, chart, labs and discussed the procedure including the risks, benefits and alternatives for the proposed anesthesia with the patient or authorized representative who has indicated his/her understanding and acceptance.       Plan Discussed with: Anesthesiologist  Anesthesia Plan Comments: (Labs reviewed. Platelets acceptable, patient not taking any blood thinning medications. Per RN, FHR tracing reported to be stable enough for sitting procedure. Risks and benefits discussed with patient, including PDPH,  backache, epidural hematoma, failed epidural, blood pressure changes, allergic reaction, and nerve injury. Patient expressed understanding and wished to proceed.)         Anesthesia Quick Evaluation

## 2023-04-10 NOTE — Anesthesia Postprocedure Evaluation (Signed)
Anesthesia Post Note  Patient: Tela Landero  Procedure(s) Performed: AN AD HOC LABOR EPIDURAL     Patient location during evaluation: Mother Baby Anesthesia Type: Epidural Level of consciousness: awake and alert and oriented Pain management: satisfactory to patient Vital Signs Assessment: post-procedure vital signs reviewed and stable Respiratory status: respiratory function stable Cardiovascular status: stable Postop Assessment: no headache, no backache, epidural receding, patient able to bend at knees, no signs of nausea or vomiting, adequate PO intake and able to ambulate Anesthetic complications: no   No notable events documented.  Last Vitals:  Vitals:   04/10/23 1019 04/10/23 1351  BP: 114/63 (!) 114/53  Pulse: 75 (!) 55  Resp: 16 17  Temp: 37.2 C 36.5 C  SpO2:  98%    Last Pain:  Vitals:   04/10/23 1351  TempSrc: Oral  PainSc:    Pain Goal: Patients Stated Pain Goal: 8 (04/10/23 0407)                 Carlos American

## 2023-04-10 NOTE — Progress Notes (Signed)
Epidural was out when writer did assessment on patient. Unsure of what time it was taken out or who took it out .

## 2023-04-10 NOTE — Progress Notes (Signed)
OB Progress Note  S: In to room after 2.5 minute FHT deceleration to the 90s. Upon arrival to room, FHT 175bpm.  Patient resting comfortably.  O: BP 100/60   Pulse 68   Temp 97.6 F (36.4 C) (Oral)   Resp (!) 22   Ht 5' (1.524 m)   Wt 80.4 kg   SpO2 99%   BMI 34.61 kg/m   FHT: 175bpm, moderate variablity, + accels, 2.5 minute spontaneous deceleration Toco: q2-3 minutes SVE: 7/90/-1, sutures palpated  A/P: 32 y.o. [id number] @ [redacted]w[redacted]d admitted for SROM/Labor (TOLAC).  FWB: Cat. II Labor course: Progressing naturally Pain: Epidural GBS: Positive - PCN ordered Anticipate SVD  Upon arrival to room FHT at baseline of 175bpm with moderate variability. FHT re-evaluated around 0612 and baseline returned to normal at 155bpm, moderate variability, - accel, - decel. Patient is making quick cervical change. Will continue to monitor at this time.  Steva Ready, DO

## 2023-04-10 NOTE — Lactation Note (Signed)
This note was copied from a baby's chart. Lactation Consultation Note  Patient Name: Kathryn Cruz OZHYQ'M Date: 04/10/2023 Age:34 hours Reason for consult: Initial assessment;Early term 37-38.6wks;Maternal endocrine disorder  P4- MOB reports her feeding plan is both breast and formula. MOB has done this with her 3 other children as well and had no supply issue (per MOB). MOB requested to use the DEBP for extra stimulation because she did it with her last child and stated that she really like the hospital pump. LC set up the DEBP with size 21 mm flanges. LC reviewed how to assemble it, clean it and how to use it. MOB verbalized understanding, but stated that she is not interested in pumping at this time. MOB reports that infant is nursing well and denies any pain or pinching. LC encouraged MOB to call LC for a latch assessment at some point.  LC reviewed feeding infant on cue 8-12x in 24 hrs, not allowing infant to go over 3 hrs without a feeding, CDC milk storage guidelines and LC services handout. LC encouraged MOB to call for further assistance as needed.  Maternal Data Has patient been taught Hand Expression?: No Does the patient have breastfeeding experience prior to this delivery?: Yes How long did the patient breastfeed?: 6 months with all three older children  Feeding Mother's Current Feeding Choice: Breast Milk and Formula  Lactation Tools Discussed/Used Tools: Pump;Flanges Flange Size: 21 Breast pump type: Double-Electric Breast Pump;Manual Pump Education: Setup, frequency, and cleaning;Milk Storage Reason for Pumping: MOB's request Pumping frequency: 15-20 min every 2-3 hrs  Interventions Interventions: Breast feeding basics reviewed;Hand pump;DEBP;Education;LC Services brochure  Discharge Discharge Education: Warning signs for feeding baby Pump: Hands Free;Personal  Consult Status Consult Status: Follow-up Date: 04/11/23 Follow-up type:  In-patient    Dema Severin BS, IBCLC 04/10/2023, 5:32 PM

## 2023-04-10 NOTE — H&P (Signed)
HPI: 32 y.o. [phone] @ 32 y.o estimated gestational age (as dated by 6 week ultrasound) presents for leakage of fluid. She had an initial Fern test that was negative but as she was about to discharge, she stood up and had grossly ruptured membranes at 0220. Fern test was positive. Feeling more painful contractions since fluid rupture. Desires epidural.  Leakage of fluid:  No Vaginal bleeding:  No Contractions:  No Fetal movement:  Yes  Prenatal care has been provided by Dr. Katrinka Blazing. Connye Burkitt [facility]).  ROS:  Denies fevers, chills, chest pain, visual changes, SOB, RUQ/epigastric pain, N/V, dysuria, hematuria, or sudden onset/worsening bilateral LE or facial edema.  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, well-controlled) Size greater than dates (normal growth [place]) Hx C/S x 1 (due to PPROM/breech presentation, delivered at [redacted]w[redacted]d in last pregnancy, desires TOLAC and consent signed 12/28/2022) Hx PPROM/PTD at [redacted]w[redacted]d  Obesity (BMI 32) PCOS/Pre-DM Hx gDM  Prenatal Transfer Tool  Maternal Diabetes: Yes:  Diabetes Type:  Insulin/Medication controlled Genetic Screening: Declined Maternal Ultrasounds/Referrals: Normal Fetal Ultrasounds or other Referrals:  Other: Serial growth [place] Maternal Substance Abuse:  No Significant Maternal Medications:  Meds include: Other: See above Significant Maternal Lab Results: Group B Strep positive   Prenatal Labs Blood type:  A Positive Antibody screen:  Negative CBC:  H/H 11.7/35.5 Rubella: Immune RPR:  Non-reactive Hep B:  Negative Hep C:  Negative HIV:  Negative GC/CT:  Negative Glucola:  218  3h GTT abnormal  Immunizations: Tdap: Given prenatally Flu: Given prenatally RSV: Prescription given  Contraceptive plan: undecided  OBHx:  OB History     Gravida  4   Para  3   Term  2   Preterm  1   AB      Living  3      SAB      IAB      Ectopic      Multiple  0   Live Births  3          PMHx:  See  above Meds:  PNV, Metformin 500mg  BID Allergy:  No Known Allergies SurgHx:  Past Surgical History:  Procedure Laterality Date   CESAREAN SECTION N/A 07/24/2021   Procedure: CESAREAN SECTION;  Surgeon: Steva Ready, DO;  Location: [facility] LD ORS;  Service: Obstetrics;  Laterality: N/A;   NO PAST SURGERIES     WISDOM TOOTH EXTRACTION     SocHx:   Denies Tobacco, ETOH, illicit drugs  O: There were no vitals taken for this visit. Gen. AAOx3, NAD CV.  RRR  Resp. CTAB, no wheezes/rales/rhonchi Abd. Gravid, soft, non-tender throughout, no rebound/guarding Extr.  No bilateral LE edema, no calf tenderness bilaterally SVE: 4/80/-2, sutures palpated    Last growth [place]:  03/24/2023 [redacted]w[redacted]d EFW 3078g, 6 lbs 13 oz (68%), AAFV, cephalic, posterior placenta  Weekly Fetal testing: 04/05/2023 BPP 10/10 (NST Reactive), cephalic  Serial growth ultrasound: 03/24/2023 [redacted]w[redacted]d EFW 3078g, 6 lbs 13 oz (68%), AAFV, cephalic, posterior placenta  FHT: 135 baseline, moderate variability, + accels,  - decels Toco: irregular   Labs: see orders  A/P:  32 y.o. [id number] @ [redacted]w[redacted]d who is admitted for induction of labor/TOLAC for Class A2 DM (well-controlled).  - Admit to L&D - Admit labs (CBC, T&S, RPR) - CBG with SSI q4h - CEFM/Toco - FWB:  Category I - Diet:  Clear liquids - IVF:  Per protocol - VTE Prophylaxis:  SCDs - GBS Status:  Positive - PCN ordered -  Presentation: VTX on cervical exam - Pain control:  Per patient request - Induction method:  Pitocin if labor stalls - Anticipate SVD  Steva Ready, DO

## 2023-04-10 NOTE — MAU Provider Note (Cosign Needed Addendum)
S: [name] is a 32 y.o. [phone] at [redacted]w[redacted]d  who presents to MAU today complaining of leaking of fluid since yesterday. She denies vaginal bleeding. She endorses contractions. She reports normal fetal movement.    O: BP 118/78 (BP Location: Right Arm)   Pulse 87   Temp 97.9 F (36.6 C) (Oral)   Resp 14   Ht 5' (1.524 m)   Wt 80.4 kg   BMI 34.61 kg/m  GENERAL: Well-developed, well-nourished female in no acute distress.  HEAD: Normocephalic, atraumatic.  CHEST: Normal effort of breathing, regular heart rate ABDOMEN: Soft, nontender, gravid PELVIC: Normal external female genitalia. Vagina is pink and rugated with white plaques noted on walls.  Cervix with normal contour, no lesions. Curdy white discharge.  Negative pooling. Fern Collected.  Cervical exam:      Fetal Monitoring: FHT: 145 bpm, Mod Var, + One min Variable Decel x 1, +Accels Toco: Irregular  No results found for this or any previous visit (from the past 24 hours).   A: SIUP at [redacted]w[redacted]d  Membranes intact Cat I FT Candidiasis of Vagina  P: Informed that exam is reflective of yeast infection and not ROM. Discussed treatment.  Fern collected and negative.  Plan to get at least one hour of strip without variable decels and then discharge.  Gerrit Heck, CNM 04/10/2023 12:36 AM  Reassessment (1:37 AM) -NST reactive.  -Precautions reviewed with nurse. -Patient to keep induction as scheduled. -Return for any labor onset or concerns. -Encouraged to call primary office or return to MAU if symptoms worsen or with the onset of new symptoms. -Discharged to home in stable condition.  Cherre Robins MSN, CNM Advanced Practice Provider, Center for [facility]  Addendum 2:07 AM Nurse reports patient with SROM when getting ready to leave. Discharge discontinued Nurse to contact primary provider.   Cherre Robins MSN, CNM Advanced Practice Provider, Center for [employer]

## 2023-04-10 NOTE — Anesthesia Procedure Notes (Addendum)
Epidural Patient location during procedure: OB Start time: 04/10/2023 5:03 AM End time: 04/10/2023 5:06 AM  Staffing Anesthesiologist: Beryle Lathe, MD Performed: anesthesiologist   Preanesthetic Checklist Completed: patient identified, IV checked, risks and benefits discussed, monitors and equipment checked, pre-op evaluation and timeout performed  Epidural Patient position: sitting Prep: DuraPrep Patient monitoring: continuous pulse ox and blood pressure Approach: midline Location: L3-L4 Injection technique: LOR saline  Needle:  Needle type: Tuohy  Needle gauge: 17 G Needle length: 9 cm Needle insertion depth: 5 cm Catheter size: 19 Gauge Catheter at skin depth: 10 cm Test dose: negative and Other (1% lidocaine)  Assessment Events: blood not aspirated and no cerebrospinal fluid  Additional Notes Patient identified. Risks including, but not limited to, bleeding, infection, nerve damage, paralysis, inadequate analgesia, blood pressure changes, nausea, vomiting, allergic reaction, postpartum back pain, itching, and headache were discussed. Patient expressed understanding and wished to proceed. Sterile prep and drape, including hand hygiene, mask, and sterile gloves were used. The patient was positioned and the spine was prepped. The skin was anesthetized with lidocaine. No paraesthesia or other complication noted. The patient did not experience any signs of intravascular injection such as tinnitus or metallic taste in mouth, nor signs of intrathecal spread such as rapid motor block. Please see nursing notes for vital signs. The patient tolerated the procedure well.   Leslye Peer, MDReason for block:procedure for pain

## 2023-04-11 LAB — CBC
HCT: 34 % — ABNORMAL LOW (ref 36.0–46.0)
Hemoglobin: 10.9 g/dL — ABNORMAL LOW (ref 12.0–15.0)
MCH: 26.3 pg (ref 26.0–34.0)
MCHC: 32.1 g/dL (ref 30.0–36.0)
MCV: 81.9 fL (ref 80.0–100.0)
Platelets: 185 10*3/uL (ref 150–400)
RBC: 4.15 MIL/uL (ref 3.87–5.11)
RDW: 15.1 % (ref 11.5–15.5)
WBC: 8.1 10*3/uL (ref 4.0–10.5)
nRBC: 0 % (ref 0.0–0.2)

## 2023-04-11 LAB — GLUCOSE, CAPILLARY: Glucose-Capillary: 74 mg/dL (ref 70–99)

## 2023-04-11 MED ORDER — FLUCONAZOLE 150 MG PO TABS
150.0000 mg | ORAL_TABLET | Freq: Once | ORAL | Status: AC
  Administered 2023-04-11: 150 mg via ORAL
  Filled 2023-04-11: qty 1

## 2023-04-11 NOTE — Progress Notes (Addendum)
Postpartum Note Day #1  S:  Patient doing well.  Pain controlled.  Tolerating regular diet.   Ambulating and voiding without difficulty.  Baby will be staying until tomorrow due to inadequately treated GBS positive status. Reports yeast infection diagnosed in MAU (Terazole cream was prescribed) prior to previously anticipated discharged until SROM. Denies fevers, chills, chest pain, SOB, N/V, or worsening bilateral LE edema.  Lochia: Minimal Infant feeding:  Breast Circumcision:  N/A, female infant Contraception:  Undecided  O: Temp:  [97.7 F (36.5 C)-99 F (37.2 C)] 97.7 F (36.5 C) (12/22 0522) Pulse Rate:  [55-85] 85 (12/22 0522) Resp:  [16-20] 20 (12/22 0522) BP: (114-120)/(53-83) 120/83 (12/22 0522) SpO2:  [98 %-100 %] 100 % (12/22 0522) Gen: NAD, pleasant and cooperative CV: Regular rate Resp: Normal work of breathing Abdomen: soft, non-distended, non-tender throughout Uterus: firm, non-tender, below umbilicus Ext: No bilateral LE edema, no bilateral calf tenderness  Labs:  Recent Labs    04/10/23 0318 04/11/23 0348  HGB 12.3 10.9*  HCT 37.7 34.0*    A/P: Patient is a 32 y.o. [id number] PPD#1 s/p VBAC.  S/p VBAC - Pain well controlled  - GU: UOP is adequate - GI: Tolerating regular diet - Activity: encouraged sitting up to chair and ambulation as tolerated - DVT Prophylaxis: Ambulation, SCDs in bed - Labs: as above  Class A2DM - Fasting CBG 74  Vaginal candidiasis - Diflucan 150mg  x 1 dose ordered while inpatient  Disposition:  D/C home tomorrow.   Steva Ready, DO

## 2023-04-11 NOTE — Discharge Summary (Signed)
Postpartum Discharge Summary  Date of Service: 04/12/23     Patient Name: Kathryn Cruz DOB: [birth year] MRN: [id number]  Date of admission: 04/09/2023 Delivery date:04/10/2023 Delivering provider: Steva Ready Date of discharge: 04/12/2023  Admitting diagnosis:  SROM/Labor Gestational diabetes mellitus (GDM) treated with oral hypoglycemic therapy [O24.415] Intrauterine pregnancy: [redacted]w[redacted]d     Secondary diagnosis:  Principal Problem:   Gestational diabetes mellitus (GDM) treated with oral hypoglycemic therapy S/p successful VBAC Additional problems: Class A2 Diabetes, History of C/S x 1, Hx PPROM/PTD at [redacted]w[redacted]d, Obesity (BMI 32), PCOS/Pre-diabetes    Discharge diagnosis: Term Pregnancy Delivered and VBAC                                              Post partum procedures: None Augmentation: N/A Complications: None  Hospital course: Onset of Labor With Vaginal Delivery      32 y.o. yo [id number] at [redacted]w[redacted]d was admitted in Active Labor on 04/09/2023. Labor course was complicated by nothing.  Membrane Rupture Time/Date: 2:00 AM,04/10/2023  Delivery Method:VBAC, Spontaneous Operative Delivery:N/A Episiotomy: None Lacerations:  None Patient had a postpartum course complicated by nothing.  She is ambulating, tolerating a regular diet, passing flatus, and urinating well. Patient is discharged home in stable condition on 04/12/23.  Newborn Data: Birth date:04/10/2023 Birth time:6:53 AM Gender:Female Living status:Living Apgars:9 ,9  Weight:3410 g  Magnesium Sulfate received: No BMZ received: No Rhophylac:N/A MMR:N/A T-DaP:Given prenatally Flu: Yes RSV Vaccine received: Yes Transfusion:No Immunizations administered: Immunization History  Administered Date(s) Administered   Rsv, Bivalent, Protein Subunit Rsvpref,pf Verdis Frederickson) 03/24/2023    Physical exam  Vitals:   04/11/23 0522 04/11/23 1259 04/11/23 2129 04/12/23 0610  BP: 120/83 119/78 113/71 127/84  Pulse: 85 79  82 63  Resp: 20 20 20 18   Temp: 97.7 F (36.5 C) 97.9 F (36.6 C) 98 F (36.7 C) 98 F (36.7 C)  TempSrc: Oral Oral Oral Oral  SpO2: 100%  100% 99%  Weight:      Height:       General: alert, cooperative, and no distress Lochia: appropriate Uterine Fundus: firm Incision: N/A DVT Evaluation: No evidence of DVT seen on physical exam. No significant calf/ankle edema. Labs: Lab Results  Component Value Date   WBC 8.1 04/11/2023   HGB 10.9 (L) 04/11/2023   HCT 34.0 (L) 04/11/2023   MCV 81.9 04/11/2023   PLT 185 04/11/2023      Latest Ref Rng & Units 08/23/2019    9:01 PM  CMP  Glucose 70 - 99 mg/dL 010   BUN 6 - 20 mg/dL 8   Creatinine 2.72 - 5.36 mg/dL 6.44   Sodium 034 - 742 mmol/L 137   Potassium 3.5 - 5.1 mmol/L 3.7   Chloride 98 - 111 mmol/L 102   CO2 22 - 32 mmol/L 26   Calcium 8.9 - 10.3 mg/dL 9.2    Edinburgh Score:    04/11/2023    9:34 AM  Edinburgh Postnatal Depression Scale Screening Tool  I have been able to laugh and see the funny side of things. 0  I have looked forward with enjoyment to things. 1  I have blamed myself unnecessarily when things went wrong. 0  I have been anxious or worried for no good reason. 0  I have felt scared or panicky for no good reason. 0  Things have been getting on top  of me. 0  I have been so unhappy that I have had difficulty sleeping. 0  I have felt sad or miserable. 0  I have been so unhappy that I have been crying. 1  The thought of harming myself has occurred to me. 0  Edinburgh Postnatal Depression Scale Total 2      After visit meds:  Allergies as of 04/12/2023   No Known Allergies      Medication List     STOP taking these medications    cyclobenzaprine 10 MG tablet Commonly known as: FLEXERIL   ferrous sulfate 325 (65 FE) MG tablet   metFORMIN 500 MG tablet Commonly known as: GLUCOPHAGE       TAKE these medications    prenatal multivitamin Tabs tablet Take 1 tablet by mouth daily at 12  noon.   terconazole 0.8 % vaginal cream Commonly known as: TERAZOL 3 Place 1 applicator vaginally at bedtime.         Discharge home in stable condition Infant Feeding: Breast Infant Disposition:home with mother Discharge instruction: per After Visit Summary and Postpartum booklet. Activity: Advance as tolerated. Pelvic rest for 6 weeks.  Diet: routine diet Anticipated Birth Control: Unsure Postpartum Appointment:6 weeks Additional Postpartum F/U:  None Future Appointments: No future appointments.  Follow up Visit:  Follow-up Information     Steva Ready, DO Follow up in 6 week(s).   Specialty: Obstetrics and Gynecology Why: Our office will call to schedule a 6 week postpartum visit for you. Contact information: [address] [place] [address] [phone]                     04/12/2023 Lavonda Jumbo, DO

## 2023-04-12 NOTE — Lactation Note (Addendum)
This note was copied from a baby's chart. Lactation Consultation Note  Patient Name: Kathryn Cruz ZOXWR'U Date: 04/12/2023 Age:34 hours  Reason for consult: Follow-up assessment  P4, [redacted]w[redacted]d, 5.57% weight loss  Mother's milk is coming to volume. She pumped 60 ml and her breast softened to comfort. Baby was formula fed by dad while she was in the shower. Mother states now that her milk is in, she will breastfeed and supplement with her milk. Mother requested a NS due her nipples being flat and dimple inward with compression. She reports she historically uses a NS for the first few days of breastfeeding and weans baby off the NS. She demonstrated application of 24mm NS and knowledgeable to signs of milk transfer and the need to pump if baby is not softening breast. Mother requested coconut oil to apply to nipples.  Mom made aware of O/P services, breastfeeding support groups, and our phone # for post-discharge questions.       Feeding Mother's Current Feeding Choice: Breast Milk and Formula  LATCH Score  Not observed  Lactation Tools Discussed/Used Tools: Nipple Shields Nipple shield size: 24 (Mother's request) Breast pump type: Double-Electric Breast Pump Pumped volume: 60 mL  Interventions Interventions: Education  Discharge Discharge Education: Engorgement and breast care;Outpatient recommendation Pump: Personal  Consult Status Consult Status: Complete Date: 04/12/23    Omar Person 04/12/2023, 11:30 AM

## 2023-04-15 ENCOUNTER — Inpatient Hospital Stay (HOSPITAL_COMMUNITY): Payer: Medicaid Other

## 2023-04-15 ENCOUNTER — Inpatient Hospital Stay (HOSPITAL_COMMUNITY): Admission: RE | Admit: 2023-04-15 | Payer: Medicaid Other | Source: Home / Self Care

## 2023-04-17 ENCOUNTER — Telehealth (HOSPITAL_COMMUNITY): Payer: Self-pay

## 2023-04-17 NOTE — Telephone Encounter (Signed)
04/17/2023 1445  Name: Kathryn Cruz MRN: [id number] DOB: 05-Nov-1990  Reason for Call:  Transition of Care Hospital Discharge Call  Contact Status: Patient Contact Status: Complete  Language assistant needed:          Follow-Up Questions: Do You Have Any Concerns About Your Health As You Heal From Delivery?: No Do You Have Any Concerns About Your Infants Health?: No  Edinburgh Postnatal Depression Scale:  In the Past 7 Days:    PHQ2-9 Depression Scale:     Discharge Follow-up: Edinburgh score requires follow up?:  (RN explained EPDS to patient. Patient states that she recently did EPDS at pediatrician office and scored a 2. Patient declines EPDS today. She states she is doing good emotionally.) Patient was advised of the following resources:: Breastfeeding Support Group, Support Group  Post-discharge interventions: Reviewed Newborn Safe Sleep Practices  Signature  Signe Colt

## 2023-05-24 DIAGNOSIS — Z30011 Encounter for initial prescription of contraceptive pills: Secondary | ICD-10-CM | POA: Diagnosis not present

## 2023-05-24 DIAGNOSIS — O24439 Gestational diabetes mellitus in the puerperium, unspecified control: Secondary | ICD-10-CM | POA: Diagnosis not present

## 2023-06-13 ENCOUNTER — Telehealth: Payer: Medicaid Other | Admitting: Physician Assistant

## 2023-06-13 DIAGNOSIS — R1013 Epigastric pain: Secondary | ICD-10-CM

## 2023-06-13 NOTE — Progress Notes (Signed)

## 2023-07-15 DIAGNOSIS — Z30430 Encounter for insertion of intrauterine contraceptive device: Secondary | ICD-10-CM | POA: Diagnosis not present

## 2023-07-15 DIAGNOSIS — Z3202 Encounter for pregnancy test, result negative: Secondary | ICD-10-CM | POA: Diagnosis not present

## 2023-07-19 ENCOUNTER — Other Ambulatory Visit (HOSPITAL_COMMUNITY): Payer: Self-pay

## 2023-08-24 DIAGNOSIS — N941 Unspecified dyspareunia: Secondary | ICD-10-CM | POA: Diagnosis not present

## 2023-08-24 DIAGNOSIS — R42 Dizziness and giddiness: Secondary | ICD-10-CM | POA: Diagnosis not present

## 2023-08-24 DIAGNOSIS — N921 Excessive and frequent menstruation with irregular cycle: Secondary | ICD-10-CM | POA: Diagnosis not present

## 2023-08-24 DIAGNOSIS — Z30431 Encounter for routine checking of intrauterine contraceptive device: Secondary | ICD-10-CM | POA: Diagnosis not present

## 2023-08-24 DIAGNOSIS — N939 Abnormal uterine and vaginal bleeding, unspecified: Secondary | ICD-10-CM | POA: Diagnosis not present

## 2023-11-23 DIAGNOSIS — N921 Excessive and frequent menstruation with irregular cycle: Secondary | ICD-10-CM | POA: Diagnosis not present

## 2023-11-23 DIAGNOSIS — Z30432 Encounter for removal of intrauterine contraceptive device: Secondary | ICD-10-CM | POA: Diagnosis not present

## 2023-11-23 DIAGNOSIS — Z30011 Encounter for initial prescription of contraceptive pills: Secondary | ICD-10-CM | POA: Diagnosis not present

## 2024-01-10 DIAGNOSIS — Z3041 Encounter for surveillance of contraceptive pills: Secondary | ICD-10-CM | POA: Diagnosis not present

## 2024-01-10 DIAGNOSIS — Z789 Other specified health status: Secondary | ICD-10-CM | POA: Diagnosis not present

## 2024-01-10 DIAGNOSIS — Z113 Encounter for screening for infections with a predominantly sexual mode of transmission: Secondary | ICD-10-CM | POA: Diagnosis not present

## 2024-01-10 DIAGNOSIS — Z124 Encounter for screening for malignant neoplasm of cervix: Secondary | ICD-10-CM | POA: Diagnosis not present

## 2024-01-10 DIAGNOSIS — Z0000 Encounter for general adult medical examination without abnormal findings: Secondary | ICD-10-CM | POA: Diagnosis not present

## 2024-05-11 ENCOUNTER — Other Ambulatory Visit: Payer: Self-pay

## 2024-05-11 ENCOUNTER — Emergency Department (HOSPITAL_BASED_OUTPATIENT_CLINIC_OR_DEPARTMENT_OTHER)
Admission: EM | Admit: 2024-05-11 | Discharge: 2024-05-12 | Disposition: A | Discharge status: Home / Self Care | Attending: Emergency Medicine | Admitting: Emergency Medicine

## 2024-05-11 ENCOUNTER — Emergency Department (HOSPITAL_BASED_OUTPATIENT_CLINIC_OR_DEPARTMENT_OTHER)

## 2024-05-11 DIAGNOSIS — D72829 Elevated white blood cell count, unspecified: Secondary | ICD-10-CM | POA: Diagnosis not present

## 2024-05-11 DIAGNOSIS — Z79899 Other long term (current) drug therapy: Secondary | ICD-10-CM | POA: Insufficient documentation

## 2024-05-11 DIAGNOSIS — R1084 Generalized abdominal pain: Secondary | ICD-10-CM

## 2024-05-11 DIAGNOSIS — A084 Viral intestinal infection, unspecified: Secondary | ICD-10-CM | POA: Diagnosis not present

## 2024-05-11 DIAGNOSIS — R1013 Epigastric pain: Secondary | ICD-10-CM | POA: Diagnosis present

## 2024-05-11 LAB — CBC WITH DIFFERENTIAL/PLATELET
Abs Immature Granulocytes: 0.02 K/uL (ref 0.00–0.07)
Basophils Absolute: 0 K/uL (ref 0.0–0.1)
Basophils Relative: 0 %
Eosinophils Absolute: 1.9 K/uL — ABNORMAL HIGH (ref 0.0–0.5)
Eosinophils Relative: 16 %
HCT: 39.6 % (ref 36.0–46.0)
Hemoglobin: 13 g/dL (ref 12.0–15.0)
Immature Granulocytes: 0 %
Lymphocytes Relative: 26 %
Lymphs Abs: 3.1 K/uL (ref 0.7–4.0)
MCH: 25.9 pg — ABNORMAL LOW (ref 26.0–34.0)
MCHC: 32.8 g/dL (ref 30.0–36.0)
MCV: 78.9 fL — ABNORMAL LOW (ref 80.0–100.0)
Monocytes Absolute: 0.6 K/uL (ref 0.1–1.0)
Monocytes Relative: 5 %
Neutro Abs: 6.3 K/uL (ref 1.7–7.7)
Neutrophils Relative %: 53 %
Platelets: 295 K/uL (ref 150–400)
RBC: 5.02 MIL/uL (ref 3.87–5.11)
RDW: 14.2 % (ref 11.5–15.5)
WBC: 11.9 K/uL — ABNORMAL HIGH (ref 4.0–10.5)
nRBC: 0 % (ref 0.0–0.2)

## 2024-05-11 MED ORDER — ONDANSETRON HCL 4 MG/2ML IJ SOLN
INTRAMUSCULAR | Status: AC
  Filled 2024-05-11: qty 2

## 2024-05-11 MED ORDER — ONDANSETRON HCL 4 MG/2ML IJ SOLN
4.0000 mg | Freq: Once | INTRAMUSCULAR | Status: AC
  Administered 2024-05-11: 4 mg via INTRAVENOUS

## 2024-05-11 MED ORDER — MORPHINE SULFATE (PF) 4 MG/ML IV SOLN
INTRAVENOUS | Status: AC
  Filled 2024-05-11: qty 1

## 2024-05-11 MED ORDER — MORPHINE SULFATE (PF) 4 MG/ML IV SOLN
4.0000 mg | Freq: Once | INTRAVENOUS | Status: AC
  Administered 2024-05-11: 4 mg via INTRAVENOUS

## 2024-05-11 MED ORDER — LACTATED RINGERS IV BOLUS
1000.0000 mL | Freq: Once | INTRAVENOUS | Status: AC
  Administered 2024-05-11: 1000 mL via INTRAVENOUS

## 2024-05-11 NOTE — ED Provider Notes (Signed)
 "  [place] EMERGENCY DEPARTMENT AT [facility]  Provider Note  CSN: [id number] Arrival date & time: 05/11/24 2316  History No chief complaint on file.   Kathryn Cruz is a 34 y.o. female reports epigastric pain, worsening over the last 2 days, now radiating into her back. Associated with anorexia, nausea, vomiting and non blood diarrhea. No known fever. Prior c-section but otherwise no abdominal surgeries. Went to UC and sent to the ED for evaluation.    Home Medications Prior to Admission medications  Medication Sig Start Date End Date Taking? Authorizing Provider  dicyclomine (BENTYL) 20 MG tablet Take 1 tablet (20 mg total) by mouth 2 (two) times daily. 05/12/24  Yes Roselyn Carlin NOVAK, MD  ondansetron  (ZOFRAN -ODT) 4 MG disintegrating tablet Take 1 tablet (4 mg total) by mouth every 8 (eight) hours as needed for nausea or vomiting. 05/12/24  Yes Roselyn Carlin NOVAK, MD  Prenatal Vit-Fe Fumarate-FA (PRENATAL MULTIVITAMIN) TABS tablet Take 1 tablet by mouth daily at 12 noon.    [provider]  terconazole  (TERAZOL 3 ) 0.8 % vaginal cream Place 1 applicator vaginally at bedtime. 04/10/23   Synthia Raisin, CNM     Allergies    Patient has no known allergies.   Review of Systems   Review of Systems Please see HPI for pertinent positives and negatives  Physical Exam BP 116/62   Pulse 88   Temp 98.7 F (37.1 C) (Oral)   Resp 17   SpO2 99%   Physical Exam Vitals and nursing note reviewed.  Constitutional:      Appearance: Normal appearance.  HENT:     Head: Normocephalic and atraumatic.     Nose: Nose normal.     Mouth/Throat:     Mouth: Mucous membranes are moist.  Eyes:     Extraocular Movements: Extraocular movements intact.     Conjunctiva/sclera: Conjunctivae normal.  Cardiovascular:     Rate and Rhythm: Normal rate.  Pulmonary:     Effort: Pulmonary effort is normal.     Breath sounds: Normal breath sounds.  Abdominal:     General:  Abdomen is flat.     Palpations: Abdomen is soft.     Tenderness: There is abdominal tenderness in the right upper quadrant and epigastric area. There is no guarding. Negative signs include Murphy's sign and McBurney's sign.  Musculoskeletal:        General: No swelling. Normal range of motion.     Cervical back: Neck supple.  Skin:    General: Skin is warm and dry.  Neurological:     General: No focal deficit present.     Mental Status: She is alert.  Psychiatric:        Mood and Affect: Mood normal.     ED Results / Procedures / Treatments   EKG None  Procedures Procedures  Medications Ordered in the ED Medications  morphine  (PF) 4 MG/ML injection 4 mg (4 mg Intravenous Not Given 05/11/24 2349)  ondansetron  (ZOFRAN ) injection 4 mg (4 mg Intravenous Not Given 05/11/24 2349)  lactated ringers  bolus 1,000 mL (0 mLs Intravenous Stopped 05/12/24 0036)  iohexol  (OMNIPAQUE ) 300 MG/ML solution 100 mL (100 mLs Intravenous Contrast Given 05/12/24 0052)  morphine  (PF) 4 MG/ML injection 4 mg (4 mg Intravenous Given 05/12/24 0048)  ondansetron  (ZOFRAN ) injection 4 mg (4 mg Intravenous Given 05/12/24 0046)    Initial Impression and Plan  Patient here with several days of abdominal pain, now radiating into back. Consider gall stones/pancreatitis,  cholecystitis, gastroenteritis. Will check labs and send for US . Pain/nausea meds and IVF for comfort.   ED Course   Clinical Course as of 05/12/24 0124  Thu May 11, 2024  2342 CBC with mild leukocytosis. [CS]  Fri May 12, 2024  0008 CMP, lipase and HCG are unremarkable.  [CS]  0008 I personally viewed the images from radiology studies and agree with radiologist interpretation: US  with some fatty liver but otherwise unremarkable. Will send for CT given her degree of pain and tenderness.  [CS]  0122 I personally viewed the images from radiology studies and agree with radiologist interpretation: CT neg for acute surgical process. Liquid in colon  consistent with diarrheal symptoms. Suspect a viral gastroenteritis. Plan discharge with bentyl and zofran  for symptom management. PO hydration. PCP follow up, RTED for any other concerns.   [CS]    Clinical Course User Index [CS] Roselyn Carlin NOVAK, MD     MDM Rules/Calculators/A&P Medical Decision Making Problems Addressed: Generalized abdominal pain: acute illness or injury Viral gastroenteritis: acute illness or injury  Amount and/or Complexity of Data Reviewed Labs: ordered. Decision-making details documented in ED Course. Radiology: ordered and independent interpretation performed. Decision-making details documented in ED Course.  Risk Prescription drug management. Parenteral controlled substances.     Final Clinical Impression(s) / ED Diagnoses Final diagnoses:  Generalized abdominal pain  Viral gastroenteritis    Rx / DC Orders ED Discharge Orders          Ordered    dicyclomine (BENTYL) 20 MG tablet  2 times daily        05/12/24 0123    ondansetron  (ZOFRAN -ODT) 4 MG disintegrating tablet  Every 8 hours PRN        05/12/24 0123             Roselyn Carlin NOVAK, MD 05/12/24 0124  "

## 2024-05-11 NOTE — ED Triage Notes (Signed)
 Epigastric pain since Tuesday radiating into back. N/V/D. Semaglutide x7 weeks.

## 2024-05-12 ENCOUNTER — Emergency Department (HOSPITAL_BASED_OUTPATIENT_CLINIC_OR_DEPARTMENT_OTHER)

## 2024-05-12 LAB — COMPREHENSIVE METABOLIC PANEL WITH GFR
ALT: 16 U/L (ref 0–44)
AST: 19 U/L (ref 15–41)
Albumin: 4.4 g/dL (ref 3.5–5.0)
Alkaline Phosphatase: 90 U/L (ref 38–126)
Anion gap: 12 (ref 5–15)
BUN: 9 mg/dL (ref 6–20)
CO2: 22 mmol/L (ref 22–32)
Calcium: 9.1 mg/dL (ref 8.9–10.3)
Chloride: 104 mmol/L (ref 98–111)
Creatinine, Ser: 0.75 mg/dL (ref 0.44–1.00)
GFR, Estimated: >60 mL/min
Glucose, Bld: 104 mg/dL — ABNORMAL HIGH (ref 70–99)
Potassium: 3.4 mmol/L — ABNORMAL LOW (ref 3.5–5.1)
Sodium: 139 mmol/L (ref 135–145)
Total Bilirubin: 0.3 mg/dL (ref 0.0–1.2)
Total Protein: 7 g/dL (ref 6.5–8.1)

## 2024-05-12 LAB — HCG, SERUM, QUALITATIVE: Preg, Serum: NEGATIVE

## 2024-05-12 LAB — LIPASE, BLOOD: Lipase: 44 U/L (ref 11–51)

## 2024-05-12 MED ORDER — ONDANSETRON 4 MG PO TBDP: 4.0000 mg | ORAL_TABLET | Freq: Three times a day (TID) | ORAL | 0 refills | Status: AC | PRN

## 2024-05-12 MED ORDER — MORPHINE SULFATE (PF) 4 MG/ML IV SOLN
4.0000 mg | Freq: Once | INTRAVENOUS | Status: AC
  Administered 2024-05-12: 4 mg via INTRAVENOUS
  Filled 2024-05-12: qty 1

## 2024-05-12 MED ORDER — ONDANSETRON HCL 4 MG/2ML IJ SOLN
4.0000 mg | Freq: Once | INTRAMUSCULAR | Status: AC
  Administered 2024-05-12: 4 mg via INTRAVENOUS
  Filled 2024-05-12: qty 2

## 2024-05-12 MED ORDER — DICYCLOMINE HCL 20 MG PO TABS: 20.0000 mg | ORAL_TABLET | Freq: Two times a day (BID) | ORAL | 0 refills | Status: AC

## 2024-05-12 MED ORDER — IOHEXOL 300 MG/ML  SOLN
100.0000 mL | Freq: Once | INTRAMUSCULAR | Status: AC | PRN
  Administered 2024-05-12: 100 mL via INTRAVENOUS

## 2024-05-12 NOTE — ED Notes (Signed)
 Emesis event. Med ordered and given. Out to CT.
